# Patient Record
Sex: Female | Born: 1960 | Hispanic: Yes | Marital: Single | Smoking: Never smoker
Health system: Southern US, Community
[De-identification: ages and names within clinical notes are randomized; demographics above are authoritative.]

## PROBLEM LIST (undated history)

## (undated) DIAGNOSIS — I10 Essential (primary) hypertension: Secondary | ICD-10-CM

## (undated) DIAGNOSIS — R2231 Localized swelling, mass and lump, right upper limb: Secondary | ICD-10-CM

---

## 2005-02-22 ENCOUNTER — Ambulatory Visit (HOSPITAL_COMMUNITY): Admission: RE | Admit: 2005-02-22 | Discharge: 2005-02-22 | Payer: Self-pay | Admitting: Obstetrics

## 2010-10-17 ENCOUNTER — Encounter: Payer: Self-pay | Admitting: Obstetrics

## 2016-12-25 HISTORY — PX: FINGER SURGERY: SHX640

## 2017-01-15 ENCOUNTER — Encounter (HOSPITAL_BASED_OUTPATIENT_CLINIC_OR_DEPARTMENT_OTHER): Payer: Self-pay | Admitting: Emergency Medicine

## 2017-01-15 ENCOUNTER — Emergency Department (HOSPITAL_BASED_OUTPATIENT_CLINIC_OR_DEPARTMENT_OTHER): Payer: Worker's Compensation

## 2017-01-15 ENCOUNTER — Emergency Department (HOSPITAL_BASED_OUTPATIENT_CLINIC_OR_DEPARTMENT_OTHER)
Admission: EM | Admit: 2017-01-15 | Discharge: 2017-01-15 | Disposition: A | Discharge status: Home / Self Care | Payer: Worker's Compensation | Attending: Emergency Medicine | Admitting: Emergency Medicine

## 2017-01-15 DIAGNOSIS — Z23 Encounter for immunization: Secondary | ICD-10-CM | POA: Diagnosis not present

## 2017-01-15 DIAGNOSIS — Y929 Unspecified place or not applicable: Secondary | ICD-10-CM | POA: Insufficient documentation

## 2017-01-15 DIAGNOSIS — R42 Dizziness and giddiness: Secondary | ICD-10-CM | POA: Diagnosis not present

## 2017-01-15 DIAGNOSIS — W230XXA Caught, crushed, jammed, or pinched between moving objects, initial encounter: Secondary | ICD-10-CM | POA: Insufficient documentation

## 2017-01-15 DIAGNOSIS — Y990 Civilian activity done for income or pay: Secondary | ICD-10-CM | POA: Diagnosis not present

## 2017-01-15 DIAGNOSIS — S6991XA Unspecified injury of right wrist, hand and finger(s), initial encounter: Secondary | ICD-10-CM | POA: Diagnosis present

## 2017-01-15 DIAGNOSIS — Y9389 Activity, other specified: Secondary | ICD-10-CM | POA: Diagnosis not present

## 2017-01-15 DIAGNOSIS — S62632B Displaced fracture of distal phalanx of right middle finger, initial encounter for open fracture: Secondary | ICD-10-CM

## 2017-01-15 DIAGNOSIS — S61214A Laceration without foreign body of right ring finger without damage to nail, initial encounter: Secondary | ICD-10-CM | POA: Diagnosis not present

## 2017-01-15 DIAGNOSIS — S62634B Displaced fracture of distal phalanx of right ring finger, initial encounter for open fracture: Secondary | ICD-10-CM | POA: Insufficient documentation

## 2017-01-15 MED ORDER — LIDOCAINE HCL (PF) 1 % IJ SOLN
10.0000 mL | Freq: Once | INTRAMUSCULAR | Status: DC
  Filled 2017-01-15: qty 10

## 2017-01-15 MED ORDER — OXYCODONE-ACETAMINOPHEN 5-325 MG PO TABS
1.0000 | ORAL_TABLET | Freq: Once | ORAL | Status: AC
Start: 2017-01-15 — End: 2017-01-15
  Administered 2017-01-15: 1 via ORAL
  Filled 2017-01-15: qty 1

## 2017-01-15 MED ORDER — ONDANSETRON 4 MG PO TBDP
4.0000 mg | ORAL_TABLET | Freq: Once | ORAL | Status: AC
  Administered 2017-01-15: 4 mg via ORAL
  Filled 2017-01-15: qty 1

## 2017-01-15 MED ORDER — CEPHALEXIN 500 MG PO CAPS: ORAL_CAPSULE | ORAL | 0 refills | Status: DC

## 2017-01-15 MED ORDER — OXYCODONE-ACETAMINOPHEN 5-325 MG PO TABS: 1.0000 | ORAL_TABLET | Freq: Four times a day (QID) | ORAL | 0 refills | Status: DC | PRN

## 2017-01-15 MED ORDER — LIDOCAINE HCL (PF) 1 % IJ SOLN
INTRAMUSCULAR | Status: AC
  Filled 2017-01-15: qty 5

## 2017-01-15 MED ORDER — TETANUS-DIPHTH-ACELL PERTUSSIS 5-2.5-18.5 LF-MCG/0.5 IM SUSP
0.5000 mL | Freq: Once | INTRAMUSCULAR | Status: AC
  Administered 2017-01-15: 0.5 mL via INTRAMUSCULAR
  Filled 2017-01-15: qty 0.5

## 2017-01-15 NOTE — ED Provider Notes (Signed)
[facility] Provider Note   CSN: [id number] Arrival date & time: 01/15/17  2125  By signing my name below, I, Linna Darner, attest that this documentation has been prepared under the direction and in the presence of non-physician practitioner, Fayrene Helper, PA-C . Electronically Signed: Linna Darner, Scribe. 01/15/2017. 9:42 PM.  History   Chief Complaint Chief Complaint  Patient presents with  . Extremity Laceration    The history is provided by the patient. No language interpreter was used.     HPI Comments: Kari Webb is a right-hand dominant 56 y.o. female who presents to the Emergency Department complaining of a laceration to her right ring finger sustained approximately one hour ago. She states she lacerated her right ring finger while cleaning a machine that contained a large knife at work. She states she "jammed" her right middle finger during the incident as well and reports pain and swelling to this digit. Patient endorses sharp pain secondary to the wound and notes she has been nauseous and dizzy since the laceration was sustained. She soaked the wounded finger in water PTA and notes it initially bled significantly. Patient has controlled the bleeding by applying tissues to the wound. Her tetanus status is unknown. She denies vomiting, syncope, numbness/tingling, or any other associated symptoms.    History reviewed. No pertinent past medical history.  There are no active problems to display for this patient.   History reviewed. No pertinent surgical history.  OB History    No data available       Home Medications    Prior to Admission medications   Not on File    Family History History reviewed. No pertinent family history.  Social History Social History  Substance Use Topics  . Smoking status: Never Smoker  . Smokeless tobacco: Never Used  . Alcohol use No     Allergies   Asa [aspirin]   Review of Systems Review of Systems    Gastrointestinal: Positive for nausea. Negative for vomiting.  Musculoskeletal: Positive for arthralgias and joint swelling.  Skin: Positive for wound.  Neurological: Positive for dizziness. Negative for syncope and numbness.   Physical Exam Updated Vital Signs BP (!) 146/91 (BP Location: Right Arm)   Pulse 86   Temp 98.5 F (36.9 C) (Oral)   Resp 18   Ht  (1.575 m)   Wt 176 lb (79.8 kg)   SpO2 100%   BMI 32.19 kg/m   Physical Exam  Constitutional: She is oriented to person, place, and time. She appears well-developed and well-nourished. No distress.  HENT:  Head: Normocephalic and atraumatic.  Eyes: Conjunctivae and EOM are normal.  Neck: Neck supple. No tracheal deviation present.  Cardiovascular: Normal rate.   Pulmonary/Chest: Effort normal. No respiratory distress.  Musculoskeletal: Normal range of motion. She exhibits tenderness.  Tenderness and swelling noted to tip of the right third finger on palpation without nail involvement.   Neurological: She is alert and oriented to person, place, and time.  Skin: Skin is warm and dry. Laceration noted.  Right hand, ring finger: There is a 3 cm deep laceration over the pad of the finger without nail involvement. There is a subungual hematoma involving 90% of the nail. Sensation is intact distally. No joint involvement at the DIP.  Psychiatric: She has a normal mood and affect. Her behavior is normal.  Nursing note and vitals reviewed.  ED Treatments / Results  Labs (all labs ordered are listed, but only abnormal results are displayed)  Labs Reviewed - No data to display  EKG  EKG Interpretation None       Radiology Dg Hand Complete Right  Result Date: 01/15/2017 CLINICAL DATA:  56 year old female with cut in the distal aspect of the fourth digit EXAM: RIGHT HAND - COMPLETE 3+ VIEW COMPARISON:  None. FINDINGS: There is a multi fragmented and mildly displaced fracture of the mid to distal portion of the distal  phalanx of the fourth digit. No other acute fracture identified. 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 There is no dislocation. The bones are mildly osteopenic. There is laceration of the skin of the distal aspect of the fourth digit. No radiopaque foreign object identified. IMPRESSION: Laceration of the skin of the distal fourth digit with mildly displaced multi fragmented fracture of the distal phalanx of the fourth digit. No radiopaque foreign object. Electronically Signed   By: Elgie Collard M.D.   On: 01/15/2017 22:15    Procedures .Marland KitchenLaceration Repair Date/Time: 01/15/2017 11:20 PM Performed by: Fayrene Helper Authorized by: Fayrene Helper   Consent:    Consent obtained:  Verbal   Consent given by:  Patient   Risks discussed:  Infection, poor wound healing, nerve damage and poor cosmetic result   Alternatives discussed:  No treatment, referral and delayed treatment Anesthesia (see MAR for exact dosages):    Anesthesia method:  Nerve block   Block location:  Digital block   Block needle gauge:  25 G   Block anesthetic:  Lidocaine 1% w/o epi   Block injection procedure:  Anatomic landmarks identified   Block outcome:  Anesthesia achieved Laceration details:    Location:  Finger   Finger location:  R ring finger   Length (cm):  3   Depth (mm):  9 Repair type:    Repair type:  Simple Pre-procedure details:    Preparation:  Patient was prepped and draped in usual sterile fashion Exploration:    Hemostasis achieved with:  Direct pressure   Wound exploration: wound explored through full range of motion     Wound extent: muscle damage, underlying fracture and vascular damage     Contaminated: no   Treatment:    Area cleansed with:  Betadine   Amount of cleaning:  Extensive   Irrigation solution:  Sterile saline   Irrigation method:  Pressure wash   Visualized foreign bodies/material removed: no   Skin repair:    Repair method:  Sutures Approximation:     Approximation:  Loose   Vermilion border: well-aligned   Post-procedure details:    Dressing:  Sterile dressing and splint for protection   Patient tolerance of procedure:  Tolerated well, no immediate complications   (including critical care time)    DIAGNOSTIC STUDIES: Oxygen Saturation is 100% on RA, normal by my interpretation.    COORDINATION OF CARE: 9:47 PM Discussed treatment plan with pt at bedside and pt agreed to plan.  Medications Ordered in ED Medications  ondansetron (ZOFRAN-ODT) disintegrating tablet 4 mg (not administered)  oxyCODONE-acetaminophen (PERCOCET/ROXICET) 5-325 MG per tablet 1 tablet (not administered)  Tdap (BOOSTRIX) injection 0.5 mL (not administered)     Initial Impression / Assessment and Plan / ED Course  I have reviewed the triage vital signs and the nursing notes.  Pertinent labs & imaging results that were available during my care of the patient were reviewed by me and considered in my medical decision making (see chart for details).     BP (!) 146/91 (BP Location:  Right Arm)   Pulse 86   Temp 98.5 F (36.9 C) (Oral)   Resp 18   Ht  (1.575 m)   Wt 79.8 kg   SpO2 100%   BMI 32.19 kg/m    Final Clinical Impressions(s) / ED Diagnoses  Tetanus update in the ED. Laceration occurred < 12 hours prior to repair. Discussed laceration care with pt and answered questions. Pt to f-u for suture removal in 1 days and wound check sooner should there be signs of dehiscence or infection. Pt is hemodynamically stable with no complaints prior to dc.  I have consulted oncall hand specialist Dr. Merlyn Lot who recommend keflex and call office tomorrow for f/u.  Final diagnoses:  Open displaced fracture of distal phalanx of right middle finger, initial encounter   New Prescriptions New Prescriptions   CEPHALEXIN (KEFLEX) 500 MG CAPSULE    2 caps po bid x 7 days   OXYCODONE-ACETAMINOPHEN (PERCOCET) 5-325 MG TABLET    Take 1 tablet by mouth every 6 (six)  hours as needed.   I personally performed the services described in this documentation, which was scribed in my presence. The recorded information has been reviewed and is accurate.      Fayrene Helper, PA-C 01/15/17 [id number]    Maia Plan, MD 01/16/17 [phone]

## 2017-01-15 NOTE — Discharge Instructions (Signed)
You have a broken finger with a laceration.  Please call Dr. Merlyn Lot office tomorrow for close follow up. Take percocet as needed for pain.  Take Keflex to prevent infection.  Return if you have any concerns.

## 2017-01-15 NOTE — ED Triage Notes (Signed)
Patient states that she was cleaning a machine at work and one of the blades cut her on the right ring finger

## 2017-01-15 NOTE — ED Notes (Signed)
PT discharged to home NAD.  

## 2017-05-01 ENCOUNTER — Ambulatory Visit (HOSPITAL_BASED_OUTPATIENT_CLINIC_OR_DEPARTMENT_OTHER): Admit: 2017-05-01 | Payer: Self-pay | Admitting: Orthopedic Surgery

## 2017-05-01 ENCOUNTER — Encounter (HOSPITAL_BASED_OUTPATIENT_CLINIC_OR_DEPARTMENT_OTHER): Payer: Self-pay

## 2017-05-01 SURGERY — EXCISION MASS
Anesthesia: Choice | Laterality: Right

## 2017-07-31 ENCOUNTER — Other Ambulatory Visit: Payer: Self-pay | Admitting: Orthopedic Surgery

## 2017-07-31 DIAGNOSIS — R2231 Localized swelling, mass and lump, right upper limb: Secondary | ICD-10-CM

## 2017-08-10 ENCOUNTER — Ambulatory Visit
Admission: RE | Admit: 2017-08-10 | Discharge: 2017-08-10 | Disposition: A | Discharge status: Home / Self Care | Payer: Worker's Compensation | Source: Ambulatory Visit | Attending: Orthopedic Surgery | Admitting: Orthopedic Surgery

## 2017-08-10 DIAGNOSIS — R2231 Localized swelling, mass and lump, right upper limb: Secondary | ICD-10-CM

## 2017-12-07 ENCOUNTER — Other Ambulatory Visit: Payer: Self-pay | Admitting: Orthopedic Surgery

## 2017-12-07 NOTE — Addendum Note (Signed)
Addended by: Betha LoaKUZMA, Sanjuanita Condrey on: 12/07/2017 10:16 AM   Modules accepted: Orders

## 2017-12-20 ENCOUNTER — Encounter (HOSPITAL_BASED_OUTPATIENT_CLINIC_OR_DEPARTMENT_OTHER): Payer: Self-pay | Admitting: *Deleted

## 2017-12-20 ENCOUNTER — Other Ambulatory Visit: Payer: Self-pay

## 2017-12-21 ENCOUNTER — Ambulatory Visit (HOSPITAL_BASED_OUTPATIENT_CLINIC_OR_DEPARTMENT_OTHER)
Admission: RE | Admit: 2017-12-21 | Discharge: 2017-12-21 | Disposition: A | Discharge status: Home / Self Care | Source: Ambulatory Visit | Attending: Orthopedic Surgery | Admitting: Orthopedic Surgery

## 2017-12-21 ENCOUNTER — Ambulatory Visit (HOSPITAL_BASED_OUTPATIENT_CLINIC_OR_DEPARTMENT_OTHER): Admitting: Anesthesiology

## 2017-12-21 ENCOUNTER — Encounter (HOSPITAL_BASED_OUTPATIENT_CLINIC_OR_DEPARTMENT_OTHER)
Admission: RE | Disposition: A | Discharge status: Home / Self Care | Payer: Self-pay | Source: Ambulatory Visit | Attending: Orthopedic Surgery

## 2017-12-21 ENCOUNTER — Encounter (HOSPITAL_BASED_OUTPATIENT_CLINIC_OR_DEPARTMENT_OTHER): Payer: Self-pay | Admitting: *Deleted

## 2017-12-21 ENCOUNTER — Other Ambulatory Visit: Payer: Self-pay

## 2017-12-21 DIAGNOSIS — Z886 Allergy status to analgesic agent status: Secondary | ICD-10-CM | POA: Insufficient documentation

## 2017-12-21 DIAGNOSIS — I10 Essential (primary) hypertension: Secondary | ICD-10-CM | POA: Diagnosis not present

## 2017-12-21 DIAGNOSIS — R2231 Localized swelling, mass and lump, right upper limb: Secondary | ICD-10-CM | POA: Diagnosis present

## 2017-12-21 DIAGNOSIS — Z6831 Body mass index (BMI) 31.0-31.9, adult: Secondary | ICD-10-CM | POA: Insufficient documentation

## 2017-12-21 DIAGNOSIS — M67441 Ganglion, right hand: Secondary | ICD-10-CM | POA: Insufficient documentation

## 2017-12-21 HISTORY — DX: Essential (primary) hypertension: I10

## 2017-12-21 HISTORY — DX: Localized swelling, mass and lump, right upper limb: R22.31

## 2017-12-21 HISTORY — PX: MASS EXCISION: SHX2000

## 2017-12-21 SURGERY — EXCISION MASS
Anesthesia: Monitor Anesthesia Care | Site: Hand | Laterality: Right

## 2017-12-21 MED ORDER — HYDROCODONE-ACETAMINOPHEN 5-325 MG PO TABS: ORAL_TABLET | ORAL | 0 refills | Status: DC

## 2017-12-21 MED ORDER — CHLORHEXIDINE GLUCONATE 4 % EX LIQD: 60.0000 mL | Freq: Once | CUTANEOUS | Status: DC

## 2017-12-21 MED ORDER — PROPOFOL 500 MG/50ML IV EMUL
INTRAVENOUS | Status: DC | PRN
  Administered 2017-12-21: 75 ug/kg/min via INTRAVENOUS

## 2017-12-21 MED ORDER — 0.9 % SODIUM CHLORIDE (POUR BTL) OPTIME
TOPICAL | Status: DC | PRN
  Administered 2017-12-21: 100 mL

## 2017-12-21 MED ORDER — MEPERIDINE HCL 25 MG/ML IJ SOLN: 6.2500 mg | INTRAMUSCULAR | Status: DC | PRN

## 2017-12-21 MED ORDER — FENTANYL CITRATE (PF) 100 MCG/2ML IJ SOLN
INTRAMUSCULAR | Status: AC
  Filled 2017-12-21: qty 2

## 2017-12-21 MED ORDER — SCOPOLAMINE 1 MG/3DAYS TD PT72
1.0000 | MEDICATED_PATCH | Freq: Once | TRANSDERMAL | Status: DC | PRN
Start: 2017-12-21 — End: 2017-12-21

## 2017-12-21 MED ORDER — ONDANSETRON HCL 4 MG/2ML IJ SOLN
INTRAMUSCULAR | Status: AC
  Filled 2017-12-21: qty 2

## 2017-12-21 MED ORDER — FENTANYL CITRATE (PF) 100 MCG/2ML IJ SOLN
50.0000 ug | INTRAMUSCULAR | Status: DC | PRN
  Administered 2017-12-21: 100 ug via INTRAVENOUS

## 2017-12-21 MED ORDER — HYDROCODONE-ACETAMINOPHEN 5-325 MG PO TABS
ORAL_TABLET | ORAL | Status: AC
  Filled 2017-12-21: qty 1

## 2017-12-21 MED ORDER — CEFAZOLIN SODIUM-DEXTROSE 2-4 GM/100ML-% IV SOLN
2.0000 g | INTRAVENOUS | Status: AC
  Administered 2017-12-21: 2 g via INTRAVENOUS

## 2017-12-21 MED ORDER — PROPOFOL 10 MG/ML IV BOLUS
INTRAVENOUS | Status: DC | PRN
  Administered 2017-12-21: 20 mg via INTRAVENOUS

## 2017-12-21 MED ORDER — MIDAZOLAM HCL 2 MG/2ML IJ SOLN
INTRAMUSCULAR | Status: AC
  Filled 2017-12-21: qty 2

## 2017-12-21 MED ORDER — MIDAZOLAM HCL 2 MG/2ML IJ SOLN: 0.5000 mg | Freq: Once | INTRAMUSCULAR | Status: DC | PRN

## 2017-12-21 MED ORDER — LIDOCAINE HCL (PF) 1 % IJ SOLN
INTRAMUSCULAR | Status: DC | PRN
  Administered 2017-12-21: 5 mL

## 2017-12-21 MED ORDER — LACTATED RINGERS IV SOLN
INTRAVENOUS | Status: DC
  Administered 2017-12-21: 10:00:00 via INTRAVENOUS

## 2017-12-21 MED ORDER — PROMETHAZINE HCL 25 MG/ML IJ SOLN: 6.2500 mg | INTRAMUSCULAR | Status: DC | PRN

## 2017-12-21 MED ORDER — FENTANYL CITRATE (PF) 100 MCG/2ML IJ SOLN
25.0000 ug | INTRAMUSCULAR | Status: DC | PRN
  Administered 2017-12-21: 50 ug via INTRAVENOUS
  Administered 2017-12-21 (×2): 25 ug via INTRAVENOUS

## 2017-12-21 MED ORDER — LIDOCAINE HCL (PF) 1 % IJ SOLN
INTRAMUSCULAR | Status: AC
  Filled 2017-12-21: qty 5

## 2017-12-21 MED ORDER — BUPIVACAINE HCL (PF) 0.25 % IJ SOLN
INTRAMUSCULAR | Status: DC | PRN
  Administered 2017-12-21: 5 mL

## 2017-12-21 MED ORDER — MIDAZOLAM HCL 2 MG/2ML IJ SOLN
1.0000 mg | INTRAMUSCULAR | Status: DC | PRN
  Administered 2017-12-21: 2 mg via INTRAVENOUS

## 2017-12-21 MED ORDER — HYDROCODONE-ACETAMINOPHEN 5-325 MG PO TABS
1.0000 | ORAL_TABLET | Freq: Once | ORAL | Status: AC
  Administered 2017-12-21: 1 via ORAL

## 2017-12-21 MED ORDER — CEFAZOLIN SODIUM-DEXTROSE 2-4 GM/100ML-% IV SOLN
INTRAVENOUS | Status: AC
  Filled 2017-12-21: qty 100

## 2017-12-21 SURGICAL SUPPLY — 59 items
BANDAGE ACE 3X5.8 VEL STRL LF (GAUZE/BANDAGES/DRESSINGS) IMPLANT
BANDAGE COBAN STERILE 2 (GAUZE/BANDAGES/DRESSINGS) IMPLANT
BENZOIN TINCTURE PRP APPL 2/3 (GAUZE/BANDAGES/DRESSINGS) IMPLANT
BLADE MINI RND TIP GREEN BEAV (BLADE) IMPLANT
BLADE SURG 15 STRL LF DISP TIS (BLADE) ×2 IMPLANT
BLADE SURG 15 STRL SS (BLADE) ×2
BNDG COHESIVE 1X5 TAN STRL LF (GAUZE/BANDAGES/DRESSINGS) IMPLANT
BNDG CONFORM 2 STRL LF (GAUZE/BANDAGES/DRESSINGS) IMPLANT
BNDG ELASTIC 2X5.8 VLCR STR LF (GAUZE/BANDAGES/DRESSINGS) IMPLANT
BNDG ESMARK 4X9 LF (GAUZE/BANDAGES/DRESSINGS) IMPLANT
BNDG GAUZE 1X2.1 STRL (MISCELLANEOUS) IMPLANT
BNDG GAUZE ELAST 4 BULKY (GAUZE/BANDAGES/DRESSINGS) IMPLANT
BNDG PLASTER X FAST 3X3 WHT LF (CAST SUPPLIES) IMPLANT
CHLORAPREP W/TINT 26ML (MISCELLANEOUS) ×2 IMPLANT
CORD BIPOLAR FORCEPS 12FT (ELECTRODE) ×2 IMPLANT
COVER BACK TABLE 60X90IN (DRAPES) ×2 IMPLANT
COVER MAYO STAND STRL (DRAPES) ×2 IMPLANT
CUFF TOURNIQUET SINGLE 18IN (TOURNIQUET CUFF) ×2 IMPLANT
DRAPE EXTREMITY T 121X128X90 (DRAPE) ×2 IMPLANT
DRAPE SURG 17X23 STRL (DRAPES) ×2 IMPLANT
GAUZE SPONGE 4X4 12PLY STRL (GAUZE/BANDAGES/DRESSINGS) ×2 IMPLANT
GAUZE XEROFORM 1X8 LF (GAUZE/BANDAGES/DRESSINGS) ×2 IMPLANT
GLOVE BIO SURGEON STRL SZ7.5 (GLOVE) ×2 IMPLANT
GLOVE BIOGEL PI IND STRL 7.0 (GLOVE) ×1 IMPLANT
GLOVE BIOGEL PI IND STRL 7.5 (GLOVE) ×2 IMPLANT
GLOVE BIOGEL PI IND STRL 8 (GLOVE) ×1 IMPLANT
GLOVE BIOGEL PI INDICATOR 7.0 (GLOVE) ×1
GLOVE BIOGEL PI INDICATOR 7.5 (GLOVE) ×2
GLOVE BIOGEL PI INDICATOR 8 (GLOVE) ×1
GLOVE SURG SS PI 8.5 STRL IVOR (GLOVE) ×1
GLOVE SURG SS PI 8.5 STRL STRW (GLOVE) ×1 IMPLANT
GLOVE SURG SYN 7.5  E (GLOVE) ×1
GLOVE SURG SYN 7.5 E (GLOVE) ×1 IMPLANT
GOWN STRL REUS W/ TWL LRG LVL3 (GOWN DISPOSABLE) IMPLANT
GOWN STRL REUS W/ TWL XL LVL3 (GOWN DISPOSABLE) ×2 IMPLANT
GOWN STRL REUS W/TWL LRG LVL3 (GOWN DISPOSABLE)
GOWN STRL REUS W/TWL XL LVL3 (GOWN DISPOSABLE) ×4 IMPLANT
NDL SAFETY ECLIPSE 18X1.5 (NEEDLE) ×1 IMPLANT
NEEDLE HYPO 18GX1.5 SHARP (NEEDLE) ×1
NEEDLE HYPO 25X1 1.5 SAFETY (NEEDLE) ×2 IMPLANT
NS IRRIG 1000ML POUR BTL (IV SOLUTION) ×2 IMPLANT
PACK BASIN DAY SURGERY FS (CUSTOM PROCEDURE TRAY) ×2 IMPLANT
PAD CAST 3X4 CTTN HI CHSV (CAST SUPPLIES) IMPLANT
PAD CAST 4YDX4 CTTN HI CHSV (CAST SUPPLIES) IMPLANT
PADDING CAST ABS 4INX4YD NS (CAST SUPPLIES)
PADDING CAST ABS COTTON 4X4 ST (CAST SUPPLIES) IMPLANT
PADDING CAST COTTON 3X4 STRL (CAST SUPPLIES)
PADDING CAST COTTON 4X4 STRL (CAST SUPPLIES)
STOCKINETTE 4X48 STRL (DRAPES) ×2 IMPLANT
STRIP CLOSURE SKIN 1/2X4 (GAUZE/BANDAGES/DRESSINGS) IMPLANT
SUT ETHILON 3 0 PS 1 (SUTURE) IMPLANT
SUT ETHILON 4 0 PS 2 18 (SUTURE) ×2 IMPLANT
SUT ETHILON 5 0 P 3 18 (SUTURE)
SUT NYLON ETHILON 5-0 P-3 1X18 (SUTURE) IMPLANT
SUT VIC AB 4-0 P2 18 (SUTURE) IMPLANT
SYR BULB 3OZ (MISCELLANEOUS) ×2 IMPLANT
SYR CONTROL 10ML LL (SYRINGE) ×2 IMPLANT
TOWEL OR 17X24 6PK STRL BLUE (TOWEL DISPOSABLE) ×4 IMPLANT
UNDERPAD 30X30 (UNDERPADS AND DIAPERS) ×2 IMPLANT

## 2017-12-21 NOTE — Brief Op Note (Signed)
12/21/2017  11:20 AM  PATIENT:  Minette Headland  57 y.o. female  PRE-OPERATIVE DIAGNOSIS:  RIGHT LONG FINGER MASS  POST-OPERATIVE DIAGNOSIS:  RIGHT LONG FINGER MASS  PROCEDURE:  Procedure(s): RIGHT LONG FINGER EXPLORATION AND EXCISION MASS (Right)  SURGEON:  Surgeon(s) and Role:    Leanora Cover, MD - Primary  PHYSICIAN ASSISTANT:   ASSISTANTS: none   ANESTHESIA:   local and MAC  EBL:  Minimal   BLOOD ADMINISTERED:none  DRAINS: none   LOCAL MEDICATIONS USED:  MARCAINE    and LIDOCAINE   SPECIMEN:  Source of Specimen:  right long finger  DISPOSITION OF SPECIMEN:  PATHOLOGY  COUNTS:  YES  TOURNIQUET:   Total Tourniquet Time Documented: Upper Arm (Right) - 21 minutes Total: Upper Arm (Right) - 21 minutes   DICTATION: .Other Dictation: Dictation Number [id number]  PLAN OF CARE: Discharge to home after PACU  PATIENT DISPOSITION:  PACU - hemodynamically stable.

## 2017-12-21 NOTE — Anesthesia Preprocedure Evaluation (Signed)
Anesthesia Evaluation  Patient identified by MRN, date of birth, ID band Patient awake    Reviewed: Allergy & Precautions, NPO status , Patient's Chart, lab work & pertinent test results  History of Anesthesia Complications Negative for: history of anesthetic complications  Airway Mallampati: II  TM Distance: >3 FB Neck ROM: Full    Dental  (+) Dental Advisory Given   Pulmonary neg pulmonary ROS,    breath sounds clear to auscultation       Cardiovascular hypertension, Pt. on medications  Rhythm:Regular Rate:Normal     Neuro/Psych negative neurological ROS     GI/Hepatic negative GI ROS, Neg liver ROS,   Endo/Other  negative endocrine ROSMorbid obesity  Renal/GU negative Renal ROS     Musculoskeletal   Abdominal (+) + obese,   Peds  Hematology negative hematology ROS (+)   Anesthesia Other Findings   Reproductive/Obstetrics                             Anesthesia Physical Anesthesia Plan  ASA: II  Anesthesia Plan: MAC   Post-op Pain Management:    Induction:   PONV Risk Score and Plan: 2 and Ondansetron and Dexamethasone  Airway Management Planned: Natural Airway and Nasal Cannula  Additional Equipment:   Intra-op Plan:   Post-operative Plan:   Informed Consent: I have reviewed the patients History and Physical, chart, labs and discussed the procedure including the risks, benefits and alternatives for the proposed anesthesia with the patient or authorized representative who has indicated his/her understanding and acceptance.   Dental advisory given  Plan Discussed with: CRNA and Surgeon  Anesthesia Plan Comments: (Plan routine monitors, MAC)        Anesthesia Quick Evaluation

## 2017-12-21 NOTE — Op Note (Signed)
NAMECARRINA, SCHOENBERGER                 ACCOUNT NO.:  [id number]  MEDICAL RECORD NO.:  [id number]  LOCATION:                                 FACILITY:  PHYSICIAN:  Leanora Cover, MD             DATE OF BIRTH:  DATE OF PROCEDURE:  12/21/2017 DATE OF DISCHARGE:                              OPERATIVE REPORT   PREOPERATIVE DIAGNOSIS:  Right long finger mass.  POSTOPERATIVE DIAGNOSIS:  Right long finger mass.  PROCEDURE:  Excision of mass of right long finger from tissues deep to the fascia measuring less than 0.5 cm in diameter.  SURGEON:  Leanora Cover, MD.  ASSISTANT:  None.  ANESTHESIA:  Local with MAC.  IV FLUIDS:  Per anesthesia flow sheet.  ESTIMATED BLOOD LOSS:  Minimal.  COMPLICATIONS:  None.  SPECIMENS:  Right long finger mass to Pathology.  TOURNIQUET TIME:  20 minutes.  DISPOSITION:  Stable to PACU.  INDICATIONS:  Ms. Lepp is a 57 year old female, who sustained an injury to the right long finger at her work.  She has noted a mass in the finger.  This is bothersome to her.  She wishes to have it removed. Risks, benefits, and alternative of surgery were discussed including the risks of blood loss, infection, damage to nerves, vessels, tendons, ligaments, and bone, failure of surgery, need for additional surgery, complications with wound healing, continued pain, recurrence of mass. She voiced understanding of these risks and elected to proceed.  OPERATIVE COURSE:  After being identified preoperatively by myself, the patient and I agreed upon the procedure and site of procedure.  Surgical site was marked.  The risks, benefits, and alternatives of surgery were reviewed and she wished to proceed.  Surgical consent had been signed. She was given IV Ancef as preoperative antibiotic prophylaxis.  She was transferred to the operating room and placed on the operating room table in supine position with the right upper extremity on an arm board.  MAC anesthesia was induced.  A  surgical pause was performed between surgeons, anesthesia, and operating room staff; and all were in agreement as to the patient, procedure, and site of procedure.  A digital block was performed with 10 mL of half and half solution of 1% plain lidocaine and 0.25% plain Marcaine.  The right upper extremity was prepped and draped in a normal sterile orthopedic fashion.  A surgical pause was again performed between surgeon, anesthesia, and operating room staff; and all were in agreement as to the procedure and site of procedure.  A Brunner-type incision was made over the PIP joint of the long finger.  This was carried down the subcutaneous tissues by spreading technique.  Bipolar electrocautery was used to obtain hemostasis.  There was scar tissue under the skin adherent to the skin and sheath underneath.  This was carefully freed up.  Some was removed and sent to Pathology for examination.  There was no mass palpable within the skin flap.  The A3 pulley was visualized.  The scar tissue was removed from top of it.  The tendons were visualized both proximal and distal to this.  The FDP and FDS  tendons were intact.  Traction on the FDP tendon would cause flexion at the DIP joint.  There was some tenosynovitis in a glistening whitish tissue running along with the FDP tendon.  This was removed and sent to Pathology as well for examination. The skin was placed back over top of the finger.  The area was palpated. No mass was palpable.  There was some prominence to the A3 pulley, which appeared thickened and irritated.  The wound was copiously irrigated with sterile saline and closed with 4-0 nylon in a horizontal mattress fashion.  It was dressed with sterile Xeroform, 4 x 4 and wrapped with a Coban dressing lightly.  Tourniquet was deflated at 20 minutes. Fingertips were pink with brisk capillary refill after deflation of tourniquet.  The operative drapes were broken down, and the patient  was awoken from anesthesia safely.  She was transferred back to stretcher and taken to PACU in stable condition.  I will see her in the office in 1 week for postoperative followup.  I will give her Norco 5/325 one to two p.o. q.6 hours p.r.n. pain, dispensed #20.     Leanora Cover, MD   ______________________________ Leanora Cover, MD    KK/MEDQ  D:  12/21/2017  T:  12/21/2017  Job:  [id number]

## 2017-12-21 NOTE — Op Note (Signed)
[id number] 

## 2017-12-21 NOTE — Transfer of Care (Signed)
Immediate Anesthesia Transfer of Care Note  Patient: Kari Webb  Procedure(s) Performed: RIGHT LONG FINGER EXPLORATION AND EXCISION MASS (Right Hand)  Patient Location: PACU  Anesthesia Type:MAC  Level of Consciousness: awake, alert  and oriented  Airway & Oxygen Therapy: Patient Spontanous Breathing and Patient connected to face mask oxygen  Post-op Assessment: Report given to RN and Post -op Vital signs reviewed and stable  Post vital signs: Reviewed and stable  Last Vitals:  Vitals Value Taken Time  BP 112/63 12/21/2017 11:24 AM  Temp    Pulse 58 12/21/2017 11:27 AM  Resp 14 12/21/2017 11:27 AM  SpO2 98 % 12/21/2017 11:27 AM  Vitals shown include unvalidated device data.  Last Pain:  Vitals:   12/21/17 0923  TempSrc: Oral  PainSc: 0-No pain      Patients Stated Pain Goal: 0 (19/37/90 2409)  Complications: No apparent anesthesia complications

## 2017-12-21 NOTE — H&P (Signed)
  Kari Webb is an 57 y.o. female.   Chief Complaint: right long finger mass HPI: 57 yo female with mass right long finger.  This is bothersome to her and she wishes to have it removed.  Allergies:  Allergies  Allergen Reactions  . Asa [Aspirin] Rash    Past Medical History:  Diagnosis Date  . Finger mass, right    long finger  . Hypertension     Past Surgical History:  Procedure Laterality Date  . FINGER SURGERY Left 12/2016    Family History: History reviewed. No pertinent family history.  Social History:   reports that she has never smoked. She has never used smokeless tobacco. She reports that she does not drink alcohol or use drugs.  Medications: Medications Prior to Admission  Medication Sig Dispense Refill  . olmesartan (BENICAR) 20 MG tablet Take 20 mg by mouth daily.      No results found for this or any previous visit (from the past 48 hour(s)).  No results found.   A comprehensive review of systems was negative.  Blood pressure 136/74, pulse (!) 55, temperature 98 F (36.7 C), temperature source Oral, resp. rate 16, height 5\' 2"  (1.575 m), weight 77.5 kg (170 lb 12.8 oz), SpO2 98 %.  General appearance: alert, cooperative and appears stated age Head: Normocephalic, without obvious abnormality, atraumatic Neck: supple, symmetrical, trachea midline Cardio: regular rate and rhythm Resp: clear to auscultation bilaterally Extremities: Intact sensation and capillary refill all digits.  +epl/fpl/io.  No wounds.  Pulses: 2+ and symmetric Skin: Skin color, texture, turgor normal. No rashes or lesions Neurologic: Grossly normal Incision/Wound: none  Assessment/Plan Right long finger mass.  Non operative and operative treatment options were discussed with the patient and patient wishes to proceed with operative treatment. Risks, benefits, and alternatives of surgery were discussed and the patient agrees with the plan of care.   Kari Webb R 12/21/2017,  9:32 AM

## 2017-12-21 NOTE — Anesthesia Postprocedure Evaluation (Signed)
Anesthesia Post Note  Patient: Kari Webb  Procedure(s) Performed: RIGHT LONG FINGER EXPLORATION AND EXCISION MASS (Right Hand)     Patient location during evaluation: PACU Anesthesia Type: MAC Level of consciousness: awake and alert, oriented and patient cooperative Pain management: pain level controlled Vital Signs Assessment: post-procedure vital signs reviewed and stable Respiratory status: spontaneous breathing, nonlabored ventilation and respiratory function stable Cardiovascular status: blood pressure returned to baseline and stable Postop Assessment: no apparent nausea or vomiting Anesthetic complications: no    Last Vitals:  Vitals:   12/21/17 1130 12/21/17 1145  BP: 126/70 138/76  Pulse: 65 (!) 51  Resp: 16 14  Temp:    SpO2: 93% 97%    Last Pain:  Vitals:   12/21/17 1145  TempSrc:   PainSc: 3                  Zailey Audia,E. Anjeanette Petzold

## 2017-12-21 NOTE — Discharge Instructions (Addendum)

## 2017-12-22 ENCOUNTER — Encounter (HOSPITAL_BASED_OUTPATIENT_CLINIC_OR_DEPARTMENT_OTHER): Payer: Self-pay | Admitting: Orthopedic Surgery

## 2018-12-04 IMAGING — US US EXTREM UP *R* LTD
1 series · 8 of 8 positions shown · non-contrast
Comparison: None

CLINICAL DATA: Nodule along the right index finger.

EXAM:
ULTRASOUND RIGHT UPPER EXTREMITY LIMITED
TECHNIQUE: Ultrasound examination of the upper extremity soft tissues was
performed in the area of clinical concern.

[Series 1: us extrem up *right* ltd · 0.02mm/px · 8 of 8 slices shown]
[im 1/8]
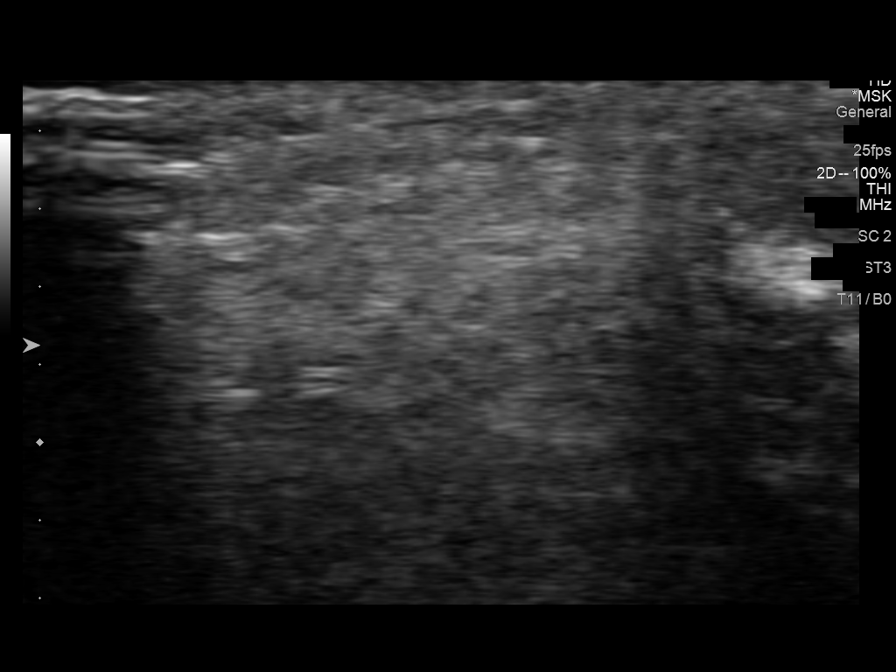
[im 2/8]
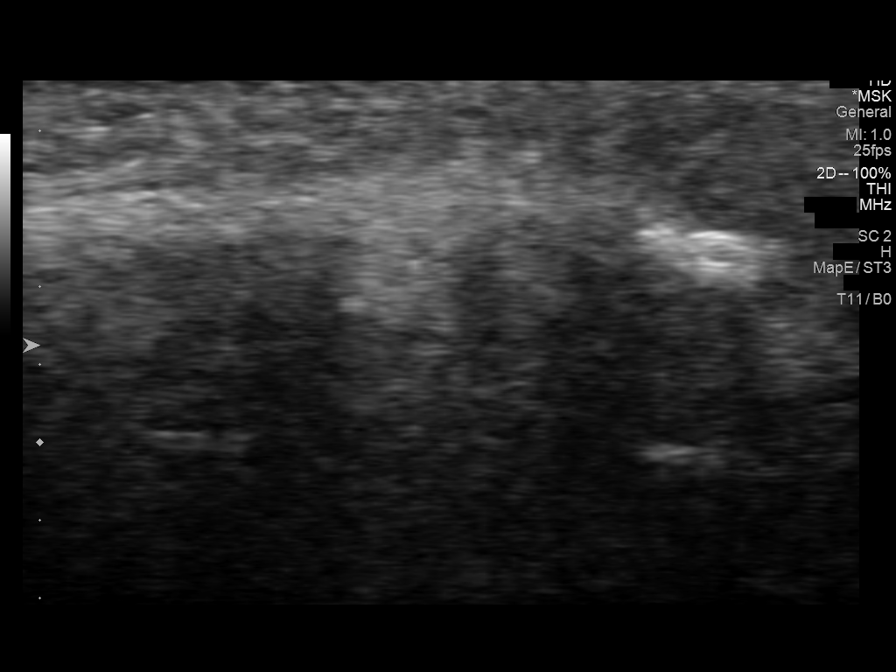
[im 3/8]
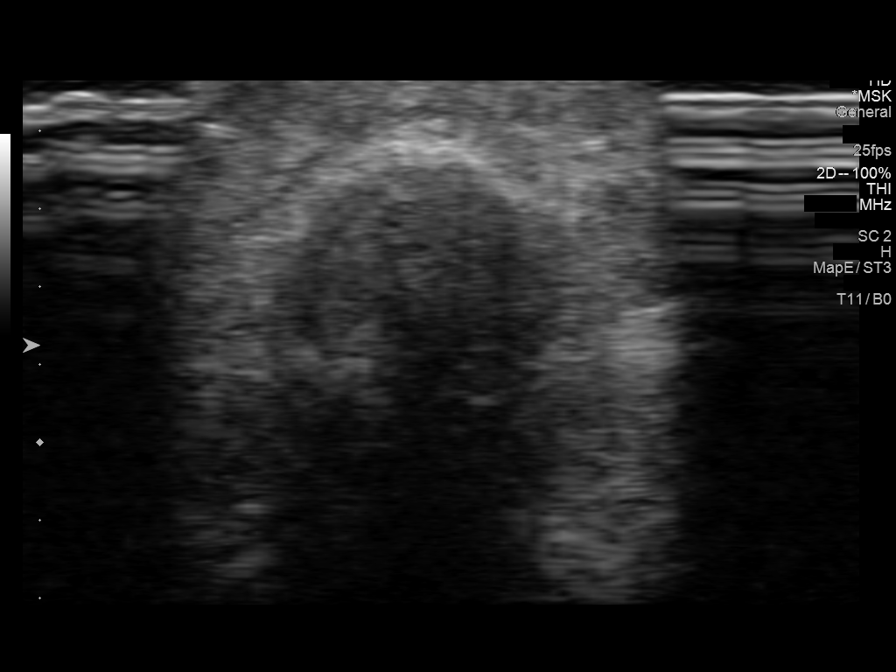
[im 4/8]
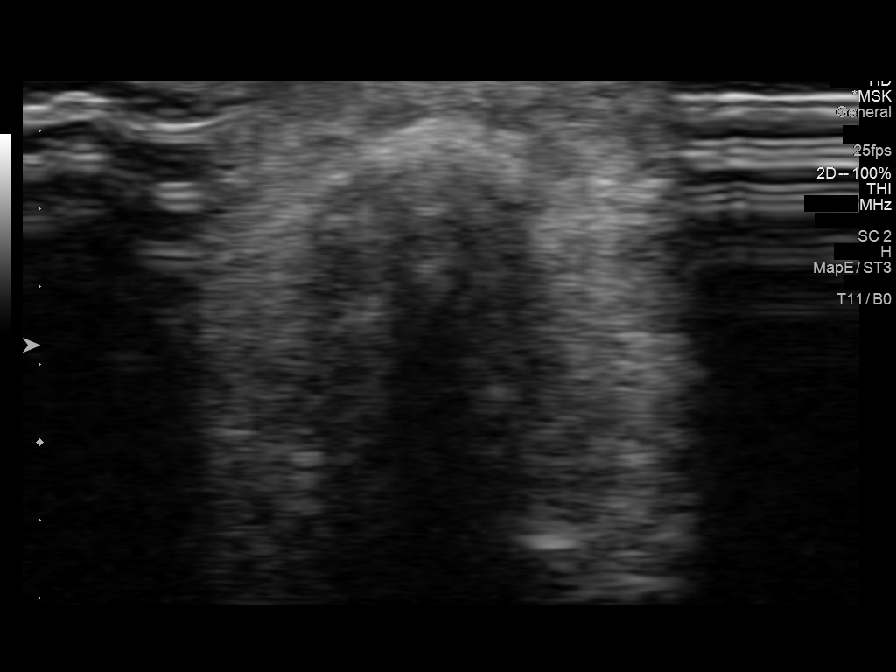
[im 5/8]
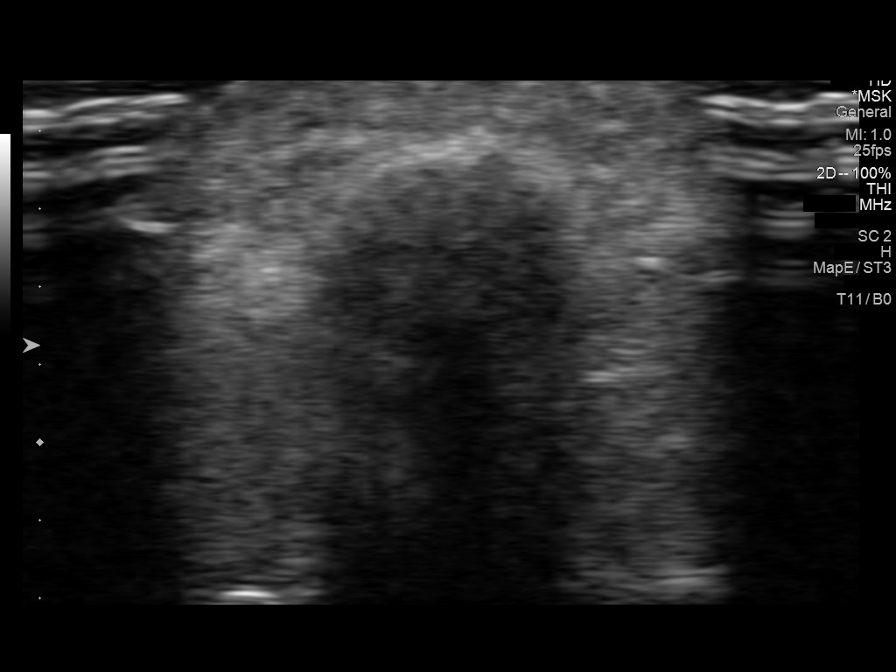
[im 6/8]
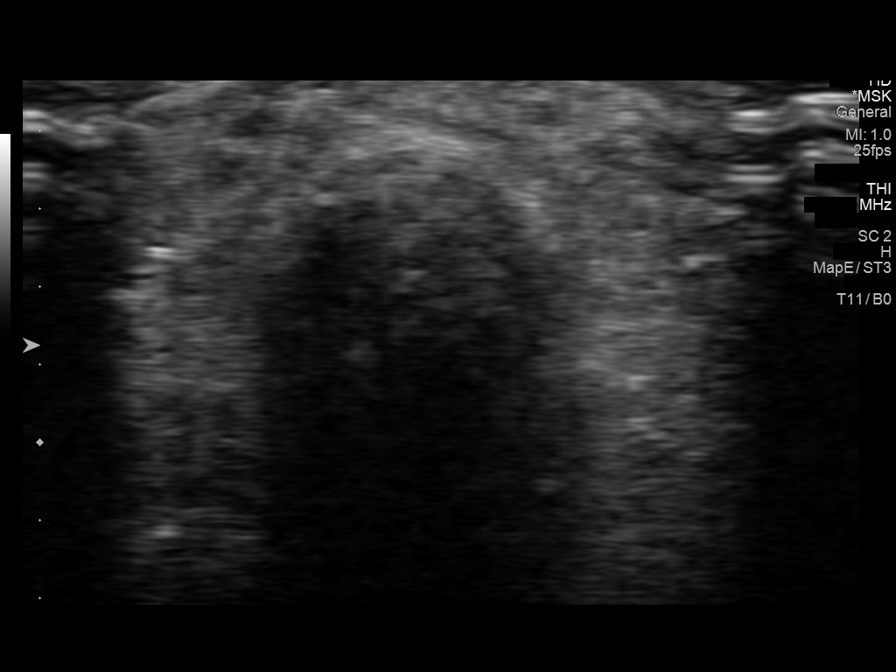
[im 7/8]
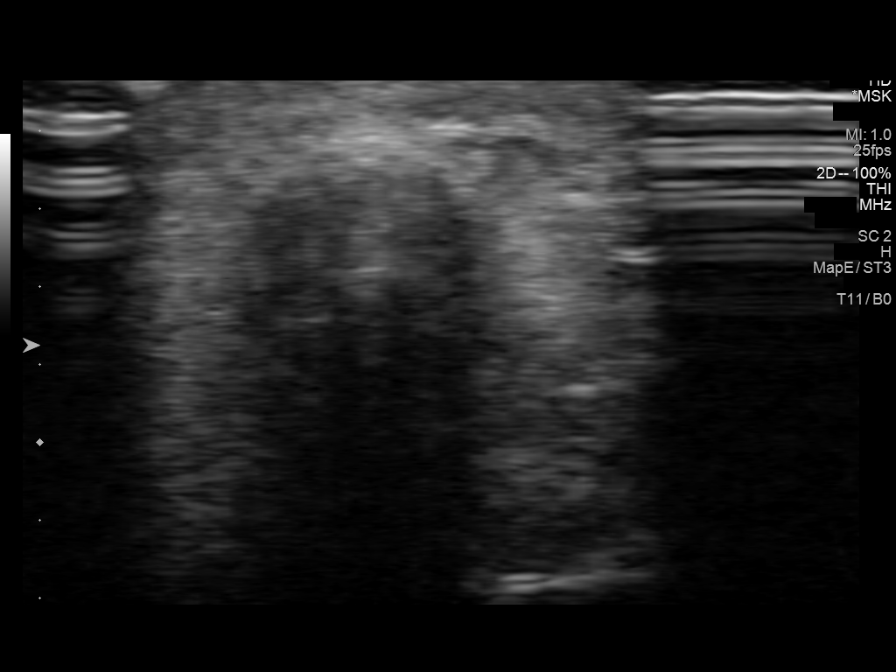
[im 8/8]
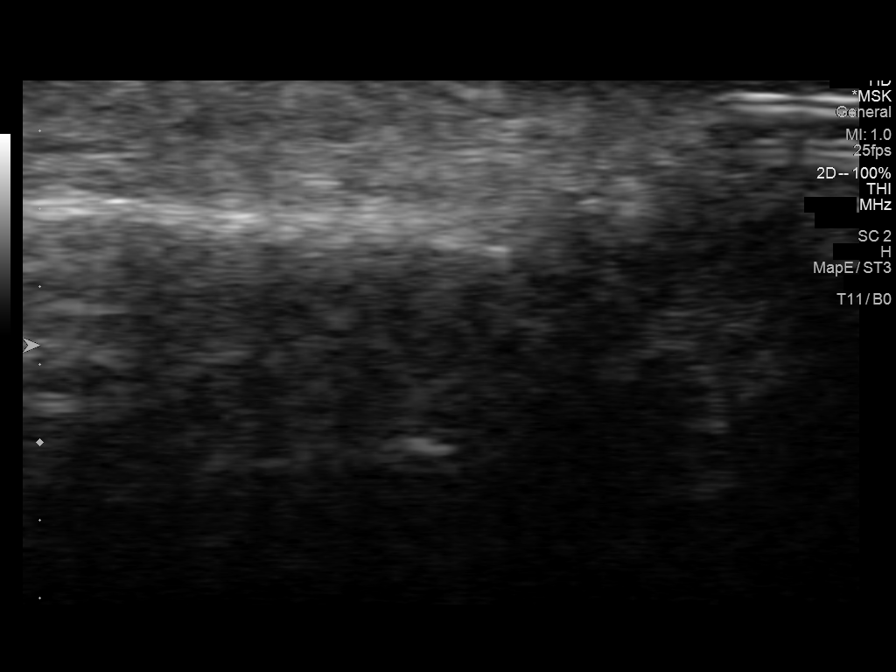

[8 of 8 positions shown; findings below may reference images not displayed]

FINDINGS: Real-time sonography of the dorsal aspect of the right middle finger
and ring finger was performed at the site of clinical concern. No
solid or cystic mass. Normal infant distortion.
IMPRESSION: No focal sonographic abnormality at the site of clinical concern
involving the right middle finger and right ring finger.

## 2019-09-25 ENCOUNTER — Other Ambulatory Visit: Payer: Self-pay | Admitting: Orthopedic Surgery

## 2019-09-25 DIAGNOSIS — M67441 Ganglion, right hand: Secondary | ICD-10-CM

## 2019-09-25 DIAGNOSIS — M65341 Trigger finger, right ring finger: Secondary | ICD-10-CM

## 2019-10-04 ENCOUNTER — Other Ambulatory Visit: Payer: Self-pay | Admitting: Orthopedic Surgery

## 2019-10-04 ENCOUNTER — Ambulatory Visit
Admission: RE | Admit: 2019-10-04 | Discharge: 2019-10-04 | Disposition: A | Discharge status: Home / Self Care | Source: Ambulatory Visit | Attending: Orthopedic Surgery | Admitting: Orthopedic Surgery

## 2019-10-04 DIAGNOSIS — M65341 Trigger finger, right ring finger: Secondary | ICD-10-CM

## 2019-10-04 DIAGNOSIS — M67441 Ganglion, right hand: Secondary | ICD-10-CM

## 2019-10-11 ENCOUNTER — Other Ambulatory Visit: Payer: Self-pay | Admitting: Orthopedic Surgery

## 2019-10-25 NOTE — Progress Notes (Signed)
Unable to reach patient to complete pre-op call for upcoming surgery on 2/5 after multiple attempts. Contacted Dr Merrilee Seashore office and lvmm for scheduler Helmut Muster to make aware

## 2019-10-28 ENCOUNTER — Other Ambulatory Visit: Payer: Self-pay

## 2019-10-28 ENCOUNTER — Encounter (HOSPITAL_BASED_OUTPATIENT_CLINIC_OR_DEPARTMENT_OTHER): Payer: Self-pay | Admitting: Orthopedic Surgery

## 2019-10-29 ENCOUNTER — Other Ambulatory Visit: Payer: Self-pay

## 2019-10-29 ENCOUNTER — Other Ambulatory Visit (HOSPITAL_COMMUNITY)
Admission: RE | Admit: 2019-10-29 | Discharge: 2019-10-29 | Disposition: A | Discharge status: Home / Self Care | Source: Ambulatory Visit | Attending: Orthopedic Surgery | Admitting: Orthopedic Surgery

## 2019-10-29 ENCOUNTER — Encounter (HOSPITAL_BASED_OUTPATIENT_CLINIC_OR_DEPARTMENT_OTHER)
Admission: RE | Admit: 2019-10-29 | Discharge: 2019-10-29 | Disposition: A | Discharge status: Home / Self Care | Source: Ambulatory Visit | Attending: Orthopedic Surgery | Admitting: Orthopedic Surgery

## 2019-10-29 DIAGNOSIS — Z01818 Encounter for other preprocedural examination: Secondary | ICD-10-CM | POA: Insufficient documentation

## 2019-10-29 DIAGNOSIS — Z20822 Contact with and (suspected) exposure to covid-19: Secondary | ICD-10-CM | POA: Insufficient documentation

## 2019-10-29 LAB — SARS CORONAVIRUS 2 (TAT 6-24 HRS): SARS Coronavirus 2: NEGATIVE

## 2019-10-29 NOTE — Progress Notes (Signed)

## 2019-11-01 ENCOUNTER — Ambulatory Visit (HOSPITAL_BASED_OUTPATIENT_CLINIC_OR_DEPARTMENT_OTHER): Admitting: Anesthesiology

## 2019-11-01 ENCOUNTER — Encounter (HOSPITAL_BASED_OUTPATIENT_CLINIC_OR_DEPARTMENT_OTHER)
Admission: RE | Disposition: A | Discharge status: Home / Self Care | Payer: Self-pay | Source: Home / Self Care | Attending: Orthopedic Surgery

## 2019-11-01 ENCOUNTER — Ambulatory Visit (HOSPITAL_BASED_OUTPATIENT_CLINIC_OR_DEPARTMENT_OTHER)
Admission: RE | Admit: 2019-11-01 | Discharge: 2019-11-01 | Disposition: A | Discharge status: Home / Self Care | Attending: Orthopedic Surgery | Admitting: Orthopedic Surgery

## 2019-11-01 ENCOUNTER — Other Ambulatory Visit: Payer: Self-pay

## 2019-11-01 ENCOUNTER — Encounter (HOSPITAL_BASED_OUTPATIENT_CLINIC_OR_DEPARTMENT_OTHER): Payer: Self-pay | Admitting: Orthopedic Surgery

## 2019-11-01 DIAGNOSIS — I10 Essential (primary) hypertension: Secondary | ICD-10-CM | POA: Insufficient documentation

## 2019-11-01 DIAGNOSIS — M67441 Ganglion, right hand: Secondary | ICD-10-CM | POA: Insufficient documentation

## 2019-11-01 DIAGNOSIS — M25841 Other specified joint disorders, right hand: Secondary | ICD-10-CM | POA: Diagnosis present

## 2019-11-01 DIAGNOSIS — Z886 Allergy status to analgesic agent status: Secondary | ICD-10-CM | POA: Diagnosis not present

## 2019-11-01 HISTORY — PX: MASS EXCISION: SHX2000

## 2019-11-01 SURGERY — EXCISION MASS
Anesthesia: Monitor Anesthesia Care | Site: Finger | Laterality: Right

## 2019-11-01 MED ORDER — HYDROCODONE-ACETAMINOPHEN 5-325 MG PO TABS: ORAL_TABLET | ORAL | 0 refills | Status: AC

## 2019-11-01 MED ORDER — ONDANSETRON HCL 4 MG/2ML IJ SOLN
INTRAMUSCULAR | Status: DC | PRN
  Administered 2019-11-01: 4 mg via INTRAVENOUS

## 2019-11-01 MED ORDER — MIDAZOLAM HCL 5 MG/5ML IJ SOLN
INTRAMUSCULAR | Status: DC | PRN
  Administered 2019-11-01: 2 mg via INTRAVENOUS

## 2019-11-01 MED ORDER — ONDANSETRON HCL 4 MG/2ML IJ SOLN: 4.0000 mg | Freq: Once | INTRAMUSCULAR | Status: DC | PRN

## 2019-11-01 MED ORDER — OXYCODONE HCL 5 MG/5ML PO SOLN: 5.0000 mg | Freq: Once | ORAL | Status: AC | PRN

## 2019-11-01 MED ORDER — CEFAZOLIN SODIUM-DEXTROSE 2-4 GM/100ML-% IV SOLN
2.0000 g | INTRAVENOUS | Status: AC
  Administered 2019-11-01: 2 g via INTRAVENOUS

## 2019-11-01 MED ORDER — FENTANYL CITRATE (PF) 100 MCG/2ML IJ SOLN: 50.0000 ug | INTRAMUSCULAR | Status: DC | PRN

## 2019-11-01 MED ORDER — KETOROLAC TROMETHAMINE 30 MG/ML IJ SOLN
INTRAMUSCULAR | Status: DC | PRN
  Administered 2019-11-01: 30 mg via INTRAVENOUS

## 2019-11-01 MED ORDER — LIDOCAINE HCL (PF) 1 % IJ SOLN
INTRAMUSCULAR | Status: AC
  Filled 2019-11-01: qty 5

## 2019-11-01 MED ORDER — PROPOFOL 500 MG/50ML IV EMUL
INTRAVENOUS | Status: DC | PRN
  Administered 2019-11-01: 50 ug/kg/min via INTRAVENOUS

## 2019-11-01 MED ORDER — FENTANYL CITRATE (PF) 100 MCG/2ML IJ SOLN
INTRAMUSCULAR | Status: AC
  Filled 2019-11-01: qty 2

## 2019-11-01 MED ORDER — BUPIVACAINE HCL (PF) 0.25 % IJ SOLN
INTRAMUSCULAR | Status: DC | PRN
  Administered 2019-11-01: 4 mL

## 2019-11-01 MED ORDER — FENTANYL CITRATE (PF) 100 MCG/2ML IJ SOLN
INTRAMUSCULAR | Status: DC | PRN
  Administered 2019-11-01: 100 ug via INTRAVENOUS

## 2019-11-01 MED ORDER — FENTANYL CITRATE (PF) 100 MCG/2ML IJ SOLN: 25.0000 ug | INTRAMUSCULAR | Status: DC | PRN

## 2019-11-01 MED ORDER — BUPIVACAINE HCL (PF) 0.25 % IJ SOLN
INTRAMUSCULAR | Status: AC
  Filled 2019-11-01: qty 30

## 2019-11-01 MED ORDER — CHLORHEXIDINE GLUCONATE 4 % EX LIQD: 60.0000 mL | Freq: Once | CUTANEOUS | Status: DC

## 2019-11-01 MED ORDER — 0.9 % SODIUM CHLORIDE (POUR BTL) OPTIME
TOPICAL | Status: DC | PRN
  Administered 2019-11-01: 100 mL

## 2019-11-01 MED ORDER — OXYCODONE HCL 5 MG PO TABS
5.0000 mg | ORAL_TABLET | Freq: Once | ORAL | Status: AC | PRN
  Administered 2019-11-01: 5 mg via ORAL

## 2019-11-01 MED ORDER — CEFAZOLIN SODIUM-DEXTROSE 2-4 GM/100ML-% IV SOLN
INTRAVENOUS | Status: AC
  Filled 2019-11-01: qty 100

## 2019-11-01 MED ORDER — MIDAZOLAM HCL 2 MG/2ML IJ SOLN
INTRAMUSCULAR | Status: AC
  Filled 2019-11-01: qty 2

## 2019-11-01 MED ORDER — OXYCODONE HCL 5 MG PO TABS
ORAL_TABLET | ORAL | Status: AC
  Filled 2019-11-01: qty 1

## 2019-11-01 MED ORDER — LIDOCAINE HCL (PF) 1 % IJ SOLN
INTRAMUSCULAR | Status: DC | PRN
  Administered 2019-11-01: 4 mL

## 2019-11-01 MED ORDER — LACTATED RINGERS IV SOLN: INTRAVENOUS | Status: DC

## 2019-11-01 MED ORDER — ONDANSETRON HCL 4 MG/2ML IJ SOLN
INTRAMUSCULAR | Status: AC
  Filled 2019-11-01: qty 2

## 2019-11-01 MED ORDER — MIDAZOLAM HCL 2 MG/2ML IJ SOLN: 1.0000 mg | INTRAMUSCULAR | Status: DC | PRN

## 2019-11-01 SURGICAL SUPPLY — 50 items
BENZOIN TINCTURE PRP APPL 2/3 (GAUZE/BANDAGES/DRESSINGS) IMPLANT
BLADE MINI RND TIP GREEN BEAV (BLADE) IMPLANT
BLADE SURG 15 STRL LF DISP TIS (BLADE) ×2 IMPLANT
BLADE SURG 15 STRL SS (BLADE) ×4
BNDG COHESIVE 1X5 TAN STRL LF (GAUZE/BANDAGES/DRESSINGS) ×3 IMPLANT
BNDG COHESIVE 2X5 TAN STRL LF (GAUZE/BANDAGES/DRESSINGS) IMPLANT
BNDG CONFORM 2 STRL LF (GAUZE/BANDAGES/DRESSINGS) IMPLANT
BNDG ELASTIC 2X5.8 VLCR STR LF (GAUZE/BANDAGES/DRESSINGS) IMPLANT
BNDG ELASTIC 3X5.8 VLCR STR LF (GAUZE/BANDAGES/DRESSINGS) IMPLANT
BNDG ESMARK 4X9 LF (GAUZE/BANDAGES/DRESSINGS) ×3 IMPLANT
BNDG GAUZE 1X2.1 STRL (MISCELLANEOUS) IMPLANT
BNDG GAUZE ELAST 4 BULKY (GAUZE/BANDAGES/DRESSINGS) IMPLANT
BNDG PLASTER X FAST 3X3 WHT LF (CAST SUPPLIES) IMPLANT
CHLORAPREP W/TINT 26 (MISCELLANEOUS) ×3 IMPLANT
CLOSURE WOUND 1/2 X4 (GAUZE/BANDAGES/DRESSINGS)
CORD BIPOLAR FORCEPS 12FT (ELECTRODE) ×3 IMPLANT
COVER BACK TABLE 60X90IN (DRAPES) ×3 IMPLANT
COVER MAYO STAND STRL (DRAPES) ×3 IMPLANT
COVER WAND RF STERILE (DRAPES) IMPLANT
CUFF TOURN SGL QUICK 18X4 (TOURNIQUET CUFF) ×3 IMPLANT
DRAPE EXTREMITY T 121X128X90 (DISPOSABLE) ×3 IMPLANT
DRAPE SURG 17X23 STRL (DRAPES) ×3 IMPLANT
GAUZE SPONGE 4X4 12PLY STRL (GAUZE/BANDAGES/DRESSINGS) ×3 IMPLANT
GAUZE XEROFORM 1X8 LF (GAUZE/BANDAGES/DRESSINGS) ×3 IMPLANT
GLOVE BIO SURGEON STRL SZ7.5 (GLOVE) ×3 IMPLANT
GLOVE BIOGEL PI IND STRL 8 (GLOVE) ×1 IMPLANT
GLOVE BIOGEL PI INDICATOR 8 (GLOVE) ×2
GOWN STRL REUS W/ TWL LRG LVL3 (GOWN DISPOSABLE) ×1 IMPLANT
GOWN STRL REUS W/TWL LRG LVL3 (GOWN DISPOSABLE) ×2
GOWN STRL REUS W/TWL XL LVL3 (GOWN DISPOSABLE) ×3 IMPLANT
NEEDLE HYPO 25X1 1.5 SAFETY (NEEDLE) ×3 IMPLANT
NS IRRIG 1000ML POUR BTL (IV SOLUTION) ×3 IMPLANT
PACK BASIN DAY SURGERY FS (CUSTOM PROCEDURE TRAY) ×3 IMPLANT
PAD CAST 3X4 CTTN HI CHSV (CAST SUPPLIES) IMPLANT
PAD CAST 4YDX4 CTTN HI CHSV (CAST SUPPLIES) IMPLANT
PADDING CAST ABS 4INX4YD NS (CAST SUPPLIES)
PADDING CAST ABS COTTON 4X4 ST (CAST SUPPLIES) IMPLANT
PADDING CAST COTTON 3X4 STRL (CAST SUPPLIES)
PADDING CAST COTTON 4X4 STRL (CAST SUPPLIES)
STOCKINETTE 4X48 STRL (DRAPES) ×3 IMPLANT
STRIP CLOSURE SKIN 1/2X4 (GAUZE/BANDAGES/DRESSINGS) IMPLANT
SUT ETHILON 3 0 PS 1 (SUTURE) IMPLANT
SUT ETHILON 4 0 PS 2 18 (SUTURE) ×3 IMPLANT
SUT ETHILON 5 0 P 3 18 (SUTURE)
SUT NYLON ETHILON 5-0 P-3 1X18 (SUTURE) IMPLANT
SUT VIC AB 4-0 P2 18 (SUTURE) IMPLANT
SYR BULB 3OZ (MISCELLANEOUS) ×3 IMPLANT
SYR CONTROL 10ML LL (SYRINGE) ×3 IMPLANT
TOWEL GREEN STERILE FF (TOWEL DISPOSABLE) ×6 IMPLANT
UNDERPAD 30X36 HEAVY ABSORB (UNDERPADS AND DIAPERS) ×3 IMPLANT

## 2019-11-01 NOTE — Transfer of Care (Signed)
Immediate Anesthesia Transfer of Care Note  Patient: Kari Webb  Procedure(s) Performed: EXCISION MASS RIGHT RING FINGER (Right Finger)  Patient Location: PACU  Anesthesia Type:MAC  Level of Consciousness: awake, alert  and oriented  Airway & Oxygen Therapy: Patient Spontanous Breathing and Patient connected to face mask oxygen  Post-op Assessment: Report given to RN and Post -op Vital signs reviewed and stable  Post vital signs: Reviewed and stable  Last Vitals:  Vitals Value Taken Time  BP    Temp    Pulse    Resp    SpO2      Last Pain:  Vitals:   11/01/19 1131  TempSrc: Tympanic  PainSc: 0-No pain      Patients Stated Pain Goal: 8 (89/16/94 5038)  Complications: No apparent anesthesia complications

## 2019-11-01 NOTE — Anesthesia Preprocedure Evaluation (Addendum)
Anesthesia Evaluation  Patient identified by MRN, date of birth, ID band Patient awake    Reviewed: Allergy & Precautions, NPO status , Patient's Chart, lab work & pertinent test results  History of Anesthesia Complications Negative for: history of anesthetic complications  Airway Mallampati: II  TM Distance: >3 FB Neck ROM: Full    Dental   Pulmonary neg pulmonary ROS,    Pulmonary exam normal        Cardiovascular hypertension, Normal cardiovascular exam     Neuro/Psych negative neurological ROS  negative psych ROS   GI/Hepatic negative GI ROS, Neg liver ROS,   Endo/Other  negative endocrine ROS  Renal/GU negative Renal ROS  negative genitourinary   Musculoskeletal negative musculoskeletal ROS (+)   Abdominal   Peds  Hematology negative hematology ROS (+)   Anesthesia Other Findings   Reproductive/Obstetrics                            Anesthesia Physical Anesthesia Plan  ASA: II  Anesthesia Plan: MAC   Post-op Pain Management:    Induction: Intravenous  PONV Risk Score and Plan: 2 and Propofol infusion, TIVA and Treatment may vary due to age or medical condition  Airway Management Planned: Natural Airway, Nasal Cannula and Simple Face Mask  Additional Equipment: None  Intra-op Plan:   Post-operative Plan:   Informed Consent: I have reviewed the patients History and Physical, chart, labs and discussed the procedure including the risks, benefits and alternatives for the proposed anesthesia with the patient or authorized representative who has indicated his/her understanding and acceptance.       Plan Discussed with:   Anesthesia Plan Comments:        Anesthesia Quick Evaluation

## 2019-11-01 NOTE — H&P (Addendum)
Kari Webb is an 59 y.o. female.   Chief Complaint: right ring finger mass HPI: 59 yo female with mass right ring finger consistent with annular ligament cyst.  This is painful to her and she wishes to have it removed.  She notes mass palpable at the proximal finger flexion crease and distal into the proximal phalanx as well as proximal to the pip flexion crease. She wishes to have the mass removed at both of these areas.  Allergies:  Allergies  Allergen Reactions  . Asa [Aspirin] Rash    Past Medical History:  Diagnosis Date  . Finger mass, right    long finger  . Hypertension     Past Surgical History:  Procedure Laterality Date  . FINGER SURGERY Left 12/2016  . MASS EXCISION Right 12/21/2017   Procedure: RIGHT LONG FINGER EXPLORATION AND EXCISION MASS;  Surgeon: Betha Loa, MD;  Location: [facility];  Service: Orthopedics;  Laterality: Right;    Family History: History reviewed. No pertinent family history.  Social History:   reports that she has never smoked. She has never used smokeless tobacco. She reports current alcohol use. She reports that she does not use drugs.  Medications: Medications Prior to Admission  Medication Sig Dispense Refill  . cholecalciferol (VITAMIN D3) 25 MCG (1000 UNIT) tablet Take 1,000 Units by mouth daily.    Marland Kitchen olmesartan (BENICAR) 20 MG tablet Take 20 mg by mouth daily.    . vitamin C (ASCORBIC ACID) 250 MG tablet Take 250 mg by mouth daily.    . vitamin E (VITAMIN E) 180 MG (400 UNITS) capsule Take 400 Units by mouth daily.      No results found for this or any previous visit (from the past 48 hour(s)).  No results found.   A comprehensive review of systems was negative.  Blood pressure (!) 144/69, pulse 69, temperature (!) 97.1 F (36.2 C), temperature source Tympanic, resp. rate (!) 22, height 5\' 2"  (1.575 m), weight 75.1 kg, SpO2 99 %.  General appearance: alert, cooperative and appears stated age Head:  Normocephalic, without obvious abnormality, atraumatic Neck: supple, symmetrical, trachea midline Cardio: regular rate and rhythm Resp: clear to auscultation bilaterally Extremities: Intact sensation and capillary refill all digits.  +epl/fpl/io.  No wounds.  Pulses: 2+ and symmetric Skin: Skin color, texture, turgor normal. No rashes or lesions Neurologic: Grossly normal Incision/Wound: none  Assessment/Plan Right ring finger annular ligament cyst.  We discussed that we can remove the mass in both of these locations with the same incision.  Non operative and operative treatment options have been discussed with the patient and patient wishes to proceed with operative treatment. Risks, benefits, and alternatives of surgery have been discussed and the patient agrees with the plan of care.   11/01/2019, 12:56 PM

## 2019-11-01 NOTE — Discharge Instructions (Addendum)
[facility] Instructions Hand Surgery  Wound Care: Keep your hand elevated above the level of your heart.  Do not allow it to dangle by your side.  Keep the dressing dry and do not remove it unless your doctor advises you to do so.  He will usually change it at the time of your post-op visit.  Moving your fingers is advised to stimulate circulation but will depend on the site of your surgery.  If you have a splint applied, your doctor will advise you regarding movement.  Activity: Do not drive or operate machinery today.  Rest today and then you may return to your normal activity and work as indicated by your physician.  Diet:  Drink liquids today or eat a light diet.  You may resume a regular diet tomorrow.    General expectations: Pain for two to three days. Fingers may become slightly swollen.  Call your doctor if any of the following occur: Severe pain not relieved by pain medication. Elevated temperature. Dressing soaked with blood. Inability to move fingers. White or bluish color to fingers.   No ibuprofen before 10:30pm   Post Anesthesia Home Care Instructions  Activity: Get plenty of rest for the remainder of the day. A responsible individual must stay with you for 24 hours following the procedure.  For the next 24 hours, DO NOT: -Drive a car -Advertising copywriter -Drink alcoholic beverages -Take any medication unless instructed by your physician -Make any legal decisions or sign important papers.  Meals: Start with liquid foods such as gelatin or soup. Progress to regular foods as tolerated. Avoid greasy, spicy, heavy foods. If nausea and/or vomiting occur, drink only clear liquids until the nausea and/or vomiting subsides. Call your physician if vomiting continues.  Special Instructions/Symptoms: Your throat may feel dry or sore from the anesthesia or the breathing tube placed in your throat during surgery. If this causes discomfort, gargle with warm salt water. The  discomfort should disappear within 24 hours.  If you had a scopolamine patch placed behind your ear for the management of post- operative nausea and/or vomiting:  1. The medication in the patch is effective for 72 hours, after which it should be removed.  Wrap patch in a tissue and discard in the trash. Wash hands thoroughly with soap and water. 2. You may remove the patch earlier than 72 hours if you experience unpleasant side effects which may include dry mouth, dizziness or visual disturbances. 3. Avoid touching the patch. Wash your hands with soap and water after contact with the patch.  May take another pain pill at 9:30PM tonight.

## 2019-11-01 NOTE — Op Note (Signed)
NAME: Kari Webb MEDICAL RECORD NO: [id number] DATE OF BIRTH: June 12, 1961 FACILITY: [facility] LOCATION: [facility] PHYSICIAN: Tami Ribas, MD   OPERATIVE REPORT   DATE OF PROCEDURE: 11/01/19    PREOPERATIVE DIAGNOSIS:  Right ring finger annular ligament cyst   POSTOPERATIVE DIAGNOSIS:   Right ring finger subcutaneous fibrosis   PROCEDURE:   Right ring finger excision of fibrous mass approximately 5 mm   SURGEON:  Betha Loa, M.D.   ASSISTANT: none   ANESTHESIA:  Local with sedation   INTRAVENOUS FLUIDS:  Per anesthesia flow sheet.   ESTIMATED BLOOD LOSS:  Minimal.   COMPLICATIONS:  None.   SPECIMENS:   Ring finger mass to pathology   TOURNIQUET TIME:    Total Tourniquet Time Documented: Upper Arm (Right) - 27 minutes Total: Upper Arm (Right) - 27 minutes    DISPOSITION:  Stable to PACU.   INDICATIONS: 59 year old female with palpable mass of the right ring finger.  This is present at both the MP flexion crease and PIP flexion crease.  It is bothersome to her.  She wishes to have it removed. Risks, benefits and alternatives of surgery were discussed including the risks of blood loss, infection, damage to nerves, vessels, tendons, ligaments, bone for surgery, need for additional surgery, complications with wound healing, continued pain, stiffness, recurrence of mass.  She voiced understanding of these risks and elected to proceed.  OPERATIVE COURSE:  After being identified preoperatively by myself,  the patient and I agreed on the procedure and site of the procedure.  The surgical site was marked.  Surgical consent had been signed. She was given IV antibiotics as preoperative antibiotic prophylaxis. She was transferred to the operating room and placed on the operating table in supine position with the Right upper extremity on an arm board.  Sedation was induced by the anesthesiologist.  Surgical pause was performed between surgeons anesthesia and operating  staff and our agreement space procedure and safety.  Digital block was performed to the right ring finger with 8 cc of half-and-half solution 1% plain lidocaine and quarter percent plain Marcaine.  This is adequate to give digital anesthesia.  Right upper extremity was prepped and draped in normal sterile orthopedic fashion.  A surgical pause was performed between the surgeons, anesthesia, and operating room staff and all were in agreement as to the patient, procedure, and site of procedure.  Tourniquet at the proximal aspect of the extremity was inflated to 250 mmHg after exsanguination of the arm with an Esmarch bandage.    A Bruner type incision was made over the proximal phalanx and over the MP joint.  This is carried in subcutaneous tissues by spreading technique.  There was thickened fibrous tissue overlying the proximal finger flexion crease in the area of the palpable mass.  No annular ligament cyst was noted.  The radial and ulnar digital nerve and artery were identified and protected throughout the case.  The fibrous tissue was excised and sent to pathology.  In the area of the palpable mass at the level of the PIP flexion crease again there was thickened fibrous tissue which was removed and sent to pathology.  The volar condyles of the proximal phalanx were more palpable.  Again no annular ligament cyst was noted.  The flexor sheath appeared intact in both locations.  The area was palpated both underneath and above the skin and no palpable mass was noted.  The wound was copiously irrigated with sterile saline and closed with  4-0 nylon in a horizontal mattress fashion.  It was dressed with sterile Xeroform 4 x 4's and wrapped with a Coban dressing lightly.  The the tourniquet was deflated at 27 minutes.  Fingertips were pink with brisk capillary refill after deflation of tourniquet.  The operative  drapes were broken down.  The patient was awoken from anesthesia safely.  She was transferred back to the  stretcher and taken to PACU in stable condition.  I will see her back in the office in 1 week for postoperative followup.  I will give her a prescription for Norco 5/325 1-2 tabs PO q6 hours prn pain, dispense # 20.   Kari Cover, MD Electronically signed, 11/01/19

## 2019-11-04 LAB — SURGICAL PATHOLOGY

## 2019-11-04 NOTE — Anesthesia Postprocedure Evaluation (Signed)
Anesthesia Post Note  Patient: Roselia Snipe  Procedure(s) Performed: EXCISION MASS RIGHT RING FINGER (Right Finger)     Patient location during evaluation: PACU Anesthesia Type: MAC Level of consciousness: awake and alert Pain management: pain level controlled Vital Signs Assessment: post-procedure vital signs reviewed and stable Respiratory status: spontaneous breathing, nonlabored ventilation and respiratory function stable Cardiovascular status: blood pressure returned to baseline and stable Postop Assessment: no apparent nausea or vomiting Anesthetic complications: no    Last Vitals:  Vitals:   11/01/19 1500 11/01/19 1533  BP: 138/77 130/76  Pulse: (!) 53 (!) 53  Resp: 14 18  Temp:  36.7 C  SpO2: 97% 97%    Last Pain:  Vitals:   11/01/19 1533  TempSrc: Oral  PainSc: 5                  Lidia Collum

## 2019-11-05 ENCOUNTER — Encounter: Payer: Self-pay | Admitting: *Deleted

## 2020-12-16 ENCOUNTER — Other Ambulatory Visit: Payer: Self-pay | Admitting: Family Medicine

## 2020-12-16 DIAGNOSIS — G44309 Post-traumatic headache, unspecified, not intractable: Secondary | ICD-10-CM

## 2020-12-17 ENCOUNTER — Other Ambulatory Visit: Payer: Self-pay

## 2020-12-17 ENCOUNTER — Ambulatory Visit
Admission: RE | Admit: 2020-12-17 | Discharge: 2020-12-17 | Disposition: A | Discharge status: Home / Self Care | Payer: Worker's Compensation | Source: Ambulatory Visit | Attending: Family Medicine | Admitting: Family Medicine

## 2020-12-17 DIAGNOSIS — G44309 Post-traumatic headache, unspecified, not intractable: Secondary | ICD-10-CM

## 2021-06-03 ENCOUNTER — Emergency Department (HOSPITAL_BASED_OUTPATIENT_CLINIC_OR_DEPARTMENT_OTHER): Payer: BC Managed Care – PPO

## 2021-06-03 ENCOUNTER — Emergency Department (HOSPITAL_BASED_OUTPATIENT_CLINIC_OR_DEPARTMENT_OTHER)
Admission: EM | Admit: 2021-06-03 | Discharge: 2021-06-03 | Disposition: A | Discharge status: Home / Self Care | Payer: BC Managed Care – PPO | Attending: Emergency Medicine | Admitting: Emergency Medicine

## 2021-06-03 ENCOUNTER — Other Ambulatory Visit: Payer: Self-pay

## 2021-06-03 ENCOUNTER — Encounter (HOSPITAL_BASED_OUTPATIENT_CLINIC_OR_DEPARTMENT_OTHER): Payer: Self-pay

## 2021-06-03 DIAGNOSIS — S161XXA Strain of muscle, fascia and tendon at neck level, initial encounter: Secondary | ICD-10-CM | POA: Diagnosis not present

## 2021-06-03 DIAGNOSIS — Z79899 Other long term (current) drug therapy: Secondary | ICD-10-CM | POA: Insufficient documentation

## 2021-06-03 DIAGNOSIS — M545 Low back pain, unspecified: Secondary | ICD-10-CM | POA: Insufficient documentation

## 2021-06-03 DIAGNOSIS — Y92410 Unspecified street and highway as the place of occurrence of the external cause: Secondary | ICD-10-CM | POA: Insufficient documentation

## 2021-06-03 DIAGNOSIS — M25511 Pain in right shoulder: Secondary | ICD-10-CM | POA: Insufficient documentation

## 2021-06-03 DIAGNOSIS — M25512 Pain in left shoulder: Secondary | ICD-10-CM | POA: Insufficient documentation

## 2021-06-03 DIAGNOSIS — I10 Essential (primary) hypertension: Secondary | ICD-10-CM | POA: Insufficient documentation

## 2021-06-03 DIAGNOSIS — S199XXA Unspecified injury of neck, initial encounter: Secondary | ICD-10-CM | POA: Diagnosis present

## 2021-06-03 MED ORDER — LIDOCAINE 5 % EX PTCH: 1.0000 | MEDICATED_PATCH | CUTANEOUS | 0 refills | Status: AC

## 2021-06-03 MED ORDER — METHOCARBAMOL 500 MG PO TABS: 500.0000 mg | ORAL_TABLET | Freq: Two times a day (BID) | ORAL | 0 refills | Status: AC

## 2021-06-03 MED ORDER — KETOROLAC TROMETHAMINE 30 MG/ML IJ SOLN
30.0000 mg | Freq: Once | INTRAMUSCULAR | Status: AC
  Administered 2021-06-03: 30 mg via INTRAMUSCULAR
  Filled 2021-06-03: qty 1
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
## 2022-04-12 IMAGING — CT CT HEAD W/O CM
1 series · 15 of 30 positions shown, 19 images · non-contrast
Comparison: Head CT 09/08/2018.

CLINICAL DATA: Posttraumatic headache, not intractable, unspecified
chronicity pattern. Additional history provided by scanning
technologist: Patient reports fall hitting head on left side,
headaches and dizziness.

EXAM:
CT HEAD WITHOUT CONTRAST
TECHNIQUE: Contiguous axial images were obtained from the base of the skull
through the vertex without intravenous contrast.

[Series 2: head w/(date) · axial · 0.44mm/px · z∈[-188,-32]mm · 15 of 35 slices shown, 19 images]
[im 2/35  brain]
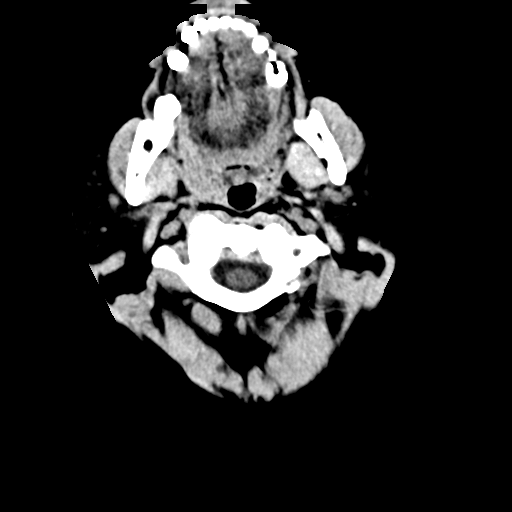
[im 2/35  bone]
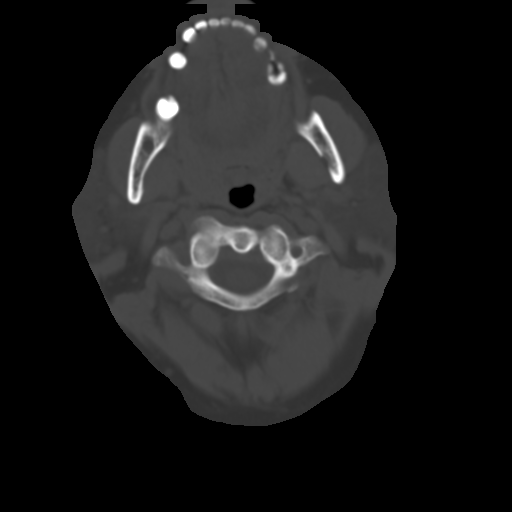
[im 4/35  brain]
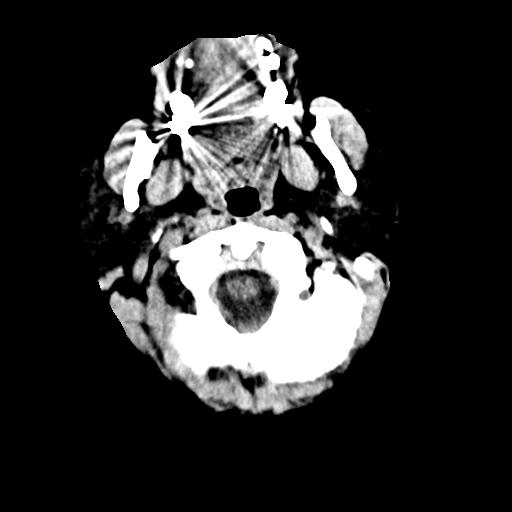
[im 6/35  brain]
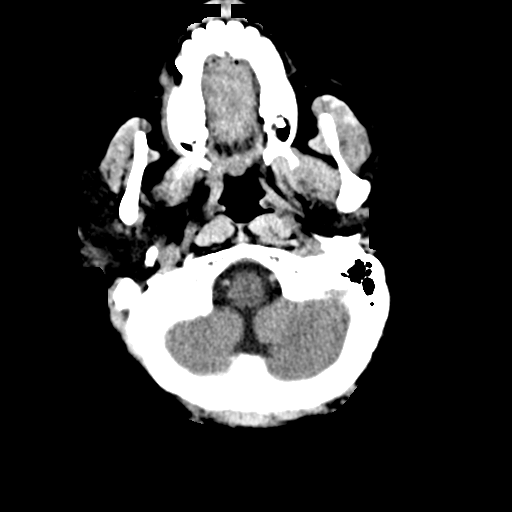
[im 9/35  brain]
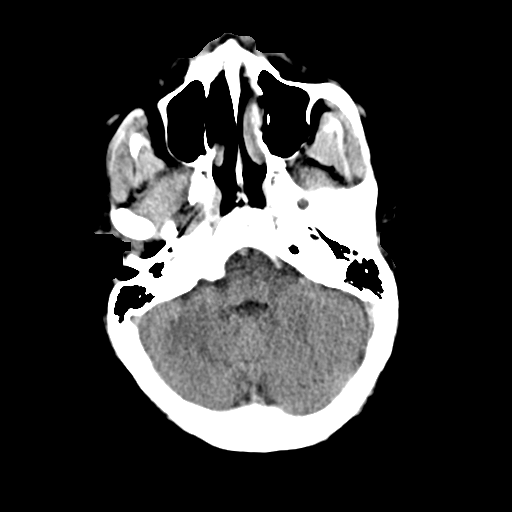
[im 11/35  brain]
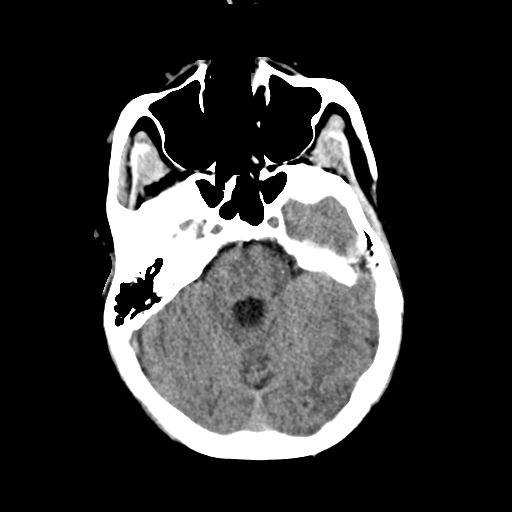
[im 11/35  bone]
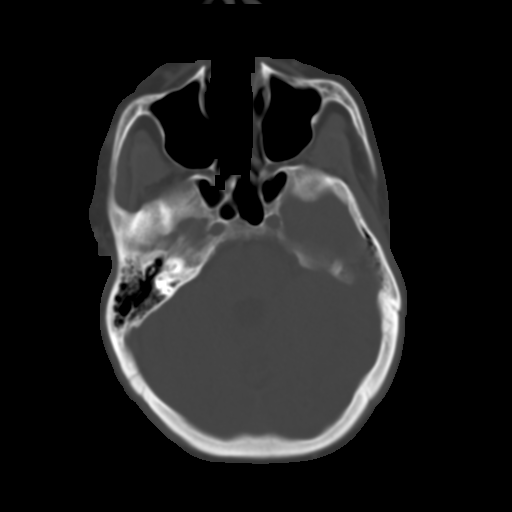
[im 13/35  brain]
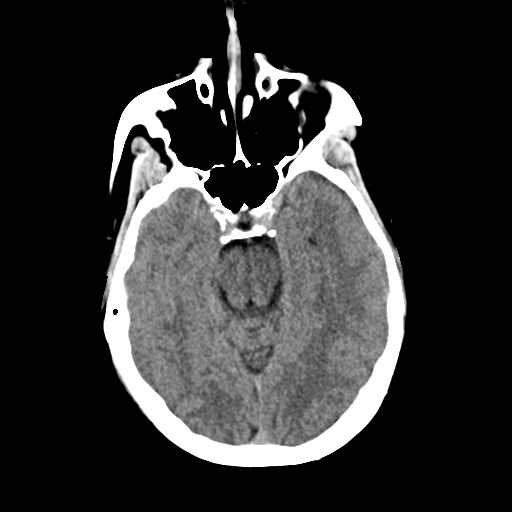
[im 16/35  brain]
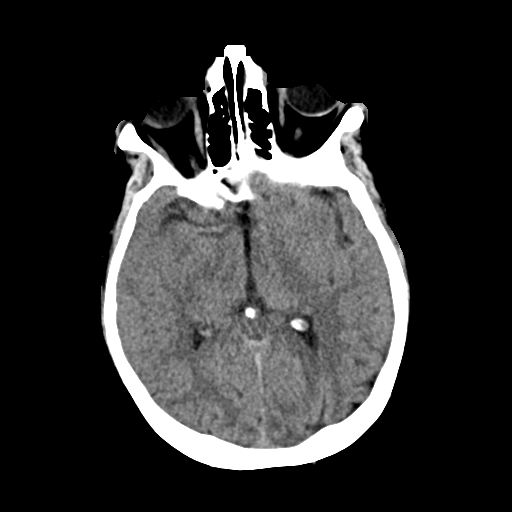
[im 18/35  brain]
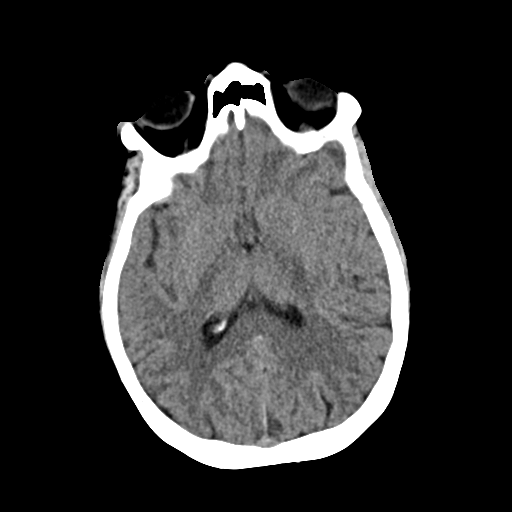
[im 19/35  brain]
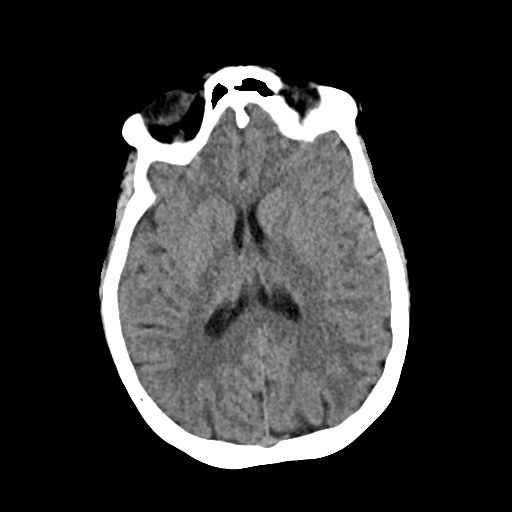
[im 19/35  bone]
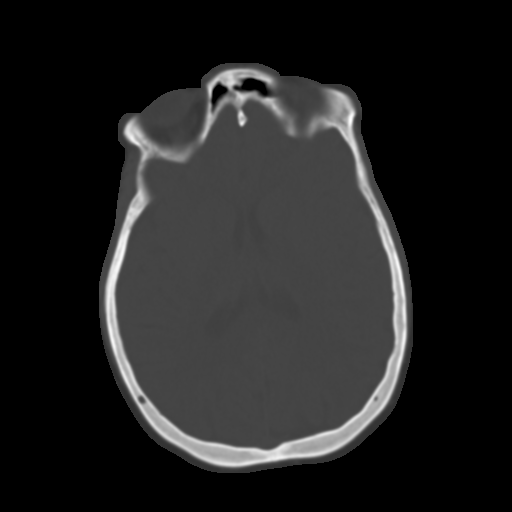
[im 22/35  brain]
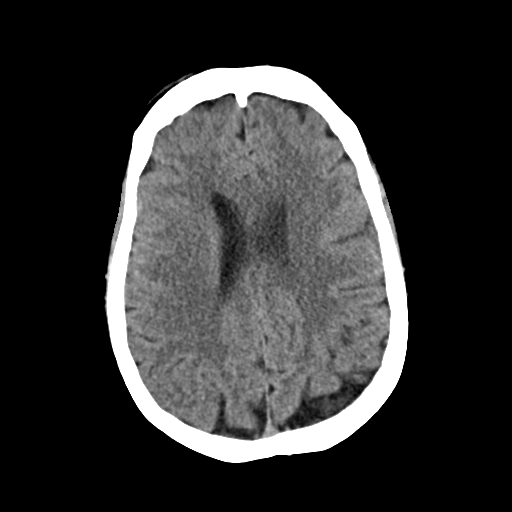
[im 24/35  brain]
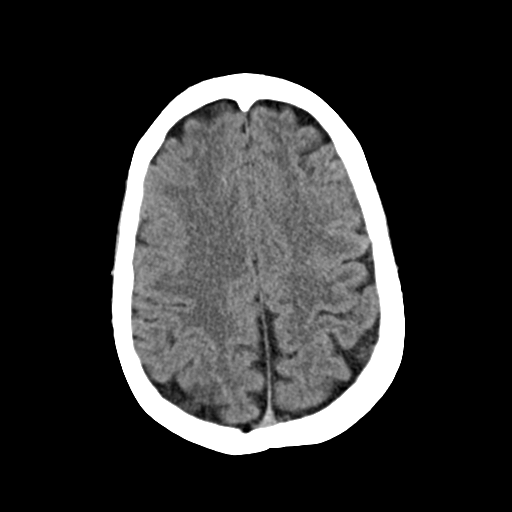
[im 26/35  brain]
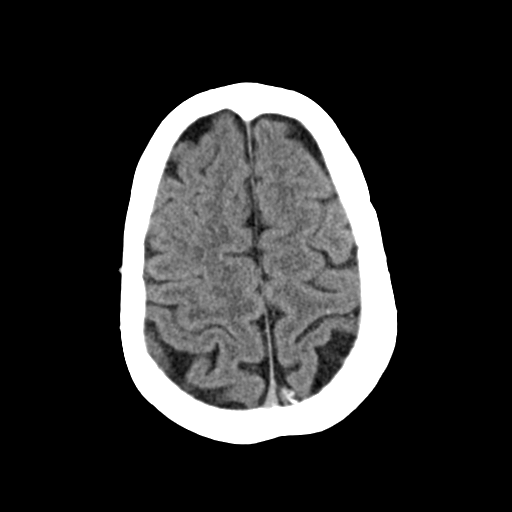
[im 29/35  brain]
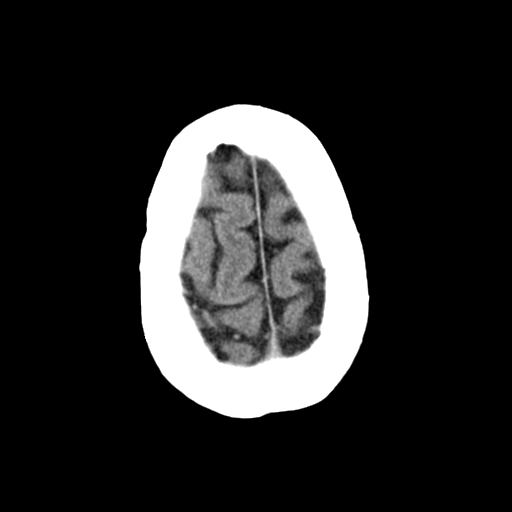
[im 29/35  bone]
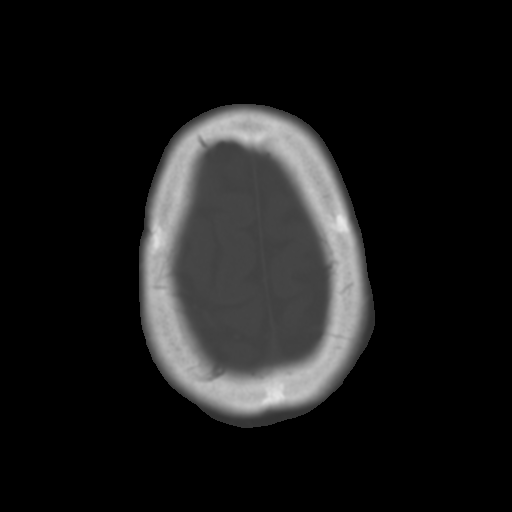
[im 31/35  brain]
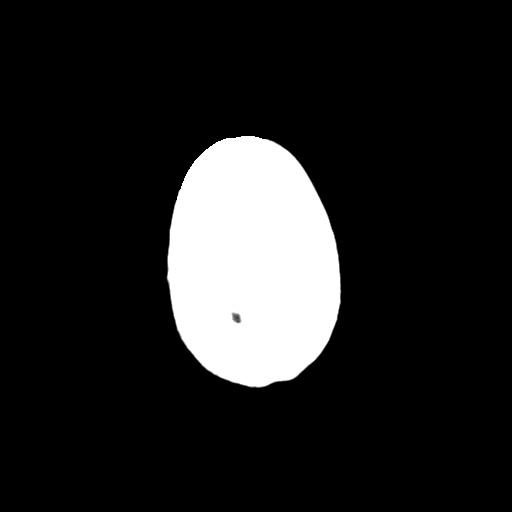
[im 33/35  brain]
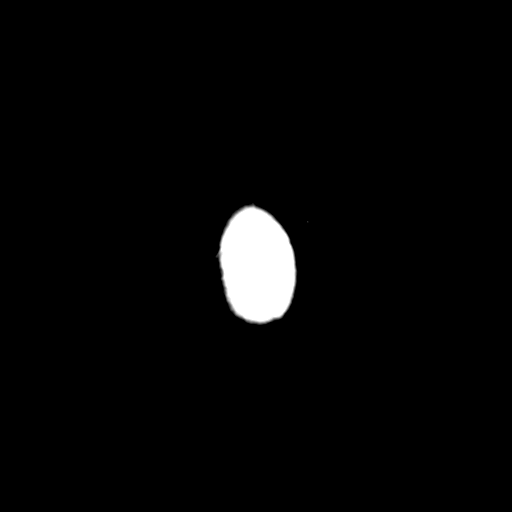

[15 of 30 positions shown; findings below may reference images not displayed]

FINDINGS: Brain:

Cerebral volume is normal.

There is no acute intracranial hemorrhage.

No demarcated cortical infarct.

No extra-axial fluid collection.

No evidence of intracranial mass.

No midline shift.

Partially empty sella turcica.

Vascular: No hyperdense vessel.  Atherosclerotic calcifications

Skull: Normal. Negative for fracture or focal lesion.

Sinuses/Orbits: Visualized orbits show no acute finding. No
significant paranasal sinus disease at the imaged levels.
IMPRESSION: No evidence of acute intracranial abnormality.

Partially empty sella turcica. This finding is very commonly
incidental, but can be associated with idiopathic intracranial
hypertension.

## 2022-07-13 ENCOUNTER — Encounter (HOSPITAL_BASED_OUTPATIENT_CLINIC_OR_DEPARTMENT_OTHER): Payer: Self-pay | Admitting: Emergency Medicine

## 2022-07-13 ENCOUNTER — Emergency Department (HOSPITAL_BASED_OUTPATIENT_CLINIC_OR_DEPARTMENT_OTHER): Payer: BC Managed Care – PPO

## 2022-07-13 ENCOUNTER — Emergency Department (HOSPITAL_BASED_OUTPATIENT_CLINIC_OR_DEPARTMENT_OTHER)
Admission: EM | Admit: 2022-07-13 | Discharge: 2022-07-13 | Disposition: A | Discharge status: Home / Self Care | Payer: BC Managed Care – PPO | Attending: Emergency Medicine | Admitting: Emergency Medicine

## 2022-07-13 DIAGNOSIS — W19XXXA Unspecified fall, initial encounter: Secondary | ICD-10-CM

## 2022-07-13 DIAGNOSIS — M25552 Pain in left hip: Secondary | ICD-10-CM | POA: Insufficient documentation

## 2022-07-13 DIAGNOSIS — I10 Essential (primary) hypertension: Secondary | ICD-10-CM | POA: Insufficient documentation

## 2022-07-13 DIAGNOSIS — W010XXA Fall on same level from slipping, tripping and stumbling without subsequent striking against object, initial encounter: Secondary | ICD-10-CM | POA: Insufficient documentation

## 2022-07-13 MED ORDER — CELECOXIB 200 MG PO CAPS: 200.0000 mg | ORAL_CAPSULE | Freq: Two times a day (BID) | ORAL | 0 refills | Status: AC

## 2022-07-13 NOTE — Discharge Instructions (Addendum)
You were seen in the emergency department for fall and hip pain.  As we discussed your x-ray did not show any broken bones.  I think that you likely strained a muscle in your left leg.  I have sent a prescription for a stronger anti-inflammatory to your pharmacy.  If you feel you need additional pain medicine, you can take some Tylenol as well.  I have attached the contact information for the orthopedic doctor, for you to follow-up with if your symptoms do not improve.  Continue to monitor how you're doing and return to the ER for new or worsening symptoms.

## 2022-07-13 NOTE — ED Provider Notes (Signed)
[facility] EMERGENCY DEPARTMENT Provider Note   CSN: [id number] Arrival date & time: 07/13/22  1609     History  Chief Complaint  Patient presents with   Fall   Hip Pain    Kari Webb is a 61 y.o. female with history of hypertension who presents the emergency department complaining of left hip pain.  Patient states that she was at work when when she slipped and fell on her left knee.  Since then she has been having pain in her left hip and left upper leg.  She has been trying ibuprofen with minimal relief.  No other trauma.  Did not hit her head.  No numbness or back pain.   Fall  Hip Pain       Home Medications Prior to Admission medications   Medication Sig Start Date End Date Taking? Authorizing Provider  celecoxib (CELEBREX) 200 MG capsule Take 1 capsule (200 mg total) by mouth 2 (two) times daily. 07/13/22  Yes Voncile Schwarz T, PA-C  cholecalciferol (VITAMIN D3) 25 MCG (1000 UNIT) tablet Take 1,000 Units by mouth daily.    [provider]  HYDROcodone-acetaminophen (NORCO) 5-325 MG tablet 1-2 tabs po q6 hours prn pain 11/01/19   Betha Loa, MD  lidocaine (LIDODERM) 5 % Place 1 patch onto the skin daily. Remove & Discard patch within 12 hours or as directed by MD 06/03/21   Loeffler, Finis Bud, PA-C  methocarbamol (ROBAXIN) 500 MG tablet Take 1 tablet (500 mg total) by mouth 2 (two) times daily. 06/03/21   Loeffler, Finis Bud, PA-C  olmesartan (BENICAR) 20 MG tablet Take 20 mg by mouth daily.    [provider]  vitamin C (ASCORBIC ACID) 250 MG tablet Take 250 mg by mouth daily.    [provider]  vitamin E (VITAMIN E) 180 MG (400 UNITS) capsule Take 400 Units by mouth daily.    [provider]      Allergies    Amlodipine, Hydrochlorothiazide, Hydrocodone-acetaminophen, Valsartan, and Asa [aspirin]    Review of Systems   Review of Systems  Musculoskeletal:  Positive for arthralgias. Negative for back pain and myalgias.   Neurological:  Negative for weakness and numbness.  All other systems reviewed and are negative.   Physical Exam Updated Vital Signs BP (!) 144/75 (BP Location: Left Arm)   Pulse 65   Temp 98.3 F (36.8 C) (Oral)   Resp 18   Ht 5\' 2"  (1.575 m)   Wt 72.6 kg   SpO2 99%   BMI 29.26 kg/m  Physical Exam Vitals and nursing note reviewed.  Constitutional:      Appearance: Normal appearance.  HENT:     Head: Normocephalic and atraumatic.  Eyes:     Conjunctiva/sclera: Conjunctivae normal.  Pulmonary:     Effort: Pulmonary effort is normal. No respiratory distress.  Musculoskeletal:     Comments: Generalized tenderness to palpation over the left hip, without bony tenderness or deformities.  Compartments of the left leg are soft.  Normal distal pulses, sensation intact.  Skin:    General: Skin is warm and dry.  Neurological:     Mental Status: She is alert.  Psychiatric:        Mood and Affect: Mood normal.        Behavior: Behavior normal.     ED Results / Procedures / Treatments   Labs (all labs ordered are listed, but only abnormal results are displayed) Labs Reviewed - No data to display  EKG None  Radiology DG Hip Unilat W or Wo Pelvis 2-3 Views Left  Result Date: 07/13/2022 CLINICAL DATA:  Left hip pain following mechanical fall EXAM: DG HIP (WITH OR WITHOUT PELVIS) 3V LEFT COMPARISON:  None Available. FINDINGS: There is no evidence of hip fracture or dislocation. There is no evidence of arthropathy or other focal bone abnormality. IMPRESSION: No acute fracture or dislocation. Electronically Signed   By: Darrin Nipper M.D.   On: 07/13/2022 16:37    Procedures Procedures    Medications Ordered in ED Medications - No data to display  ED Course/ Medical Decision Making/ A&P                           Medical Decision Making Amount and/or Complexity of Data Reviewed Radiology: ordered.  This patient is a 61 y.o. female  who presents to the ED for concern of fall  and hip pain.   Past Medical History / Co-morbidities: HTN  Physical Exam: Physical exam performed. The pertinent findings include: Normal vital signs.   Lab Tests/Imaging studies: I personally interpreted labs/imaging and the pertinent results include:  x-ray of left hip shows no fractures or dislocations. I agree with the radiologist interpretation.  Disposition: After consideration of the diagnostic results and the patients response to treatment, I feel that emergency department workup does not suggest an emergent condition requiring admission or immediate intervention beyond what has been performed at this time. The plan is: Discharged home with prescription for Celebrex and recommendation for symptomatic management of likely left upper leg muscle strain.  Given orthopedic follow-up if symptoms persist.  The patient is safe for discharge and has been instructed to return immediately for worsening symptoms, change in symptoms or any other concerns.   Final Clinical Impression(s) / ED Diagnoses Final diagnoses:  Fall, initial encounter  Left hip pain    Rx / DC Orders ED Discharge Orders          Ordered    celecoxib (CELEBREX) 200 MG capsule  2 times daily        07/13/22 1730           Portions of this report may have been transcribed using voice recognition software. Every effort was made to ensure accuracy; however, inadvertent computerized transcription errors may be present.    Estill Cotta 07/13/22 1757    Drenda Freeze, MD 07/13/22 [phone]

## 2022-07-13 NOTE — ED Triage Notes (Signed)
Mechanical fall on Friday. C/o left hip pain that radiates down lateral side of left thigh. Pt ambulatory with steady gait.

## 2023-10-26 ENCOUNTER — Encounter (HOSPITAL_BASED_OUTPATIENT_CLINIC_OR_DEPARTMENT_OTHER): Payer: Self-pay | Admitting: Emergency Medicine

## 2023-10-26 ENCOUNTER — Emergency Department (HOSPITAL_BASED_OUTPATIENT_CLINIC_OR_DEPARTMENT_OTHER): Payer: Self-pay

## 2023-10-26 ENCOUNTER — Other Ambulatory Visit: Payer: Self-pay

## 2023-10-26 ENCOUNTER — Emergency Department (HOSPITAL_BASED_OUTPATIENT_CLINIC_OR_DEPARTMENT_OTHER)
Admission: EM | Admit: 2023-10-26 | Discharge: 2023-10-26 | Disposition: A | Discharge status: Home / Self Care | Payer: Self-pay | Attending: Emergency Medicine | Admitting: Emergency Medicine

## 2023-10-26 DIAGNOSIS — I10 Essential (primary) hypertension: Secondary | ICD-10-CM | POA: Insufficient documentation

## 2023-10-26 DIAGNOSIS — J111 Influenza due to unidentified influenza virus with other respiratory manifestations: Secondary | ICD-10-CM | POA: Insufficient documentation

## 2023-10-26 DIAGNOSIS — H9202 Otalgia, left ear: Secondary | ICD-10-CM | POA: Insufficient documentation

## 2023-10-26 DIAGNOSIS — Z79899 Other long term (current) drug therapy: Secondary | ICD-10-CM | POA: Diagnosis not present

## 2023-10-26 DIAGNOSIS — R000 Tachycardia, unspecified: Secondary | ICD-10-CM | POA: Diagnosis not present

## 2023-10-26 DIAGNOSIS — Z20822 Contact with and (suspected) exposure to covid-19: Secondary | ICD-10-CM | POA: Diagnosis not present

## 2023-10-26 DIAGNOSIS — R0981 Nasal congestion: Secondary | ICD-10-CM | POA: Diagnosis present

## 2023-10-26 LAB — RESP PANEL BY RT-PCR (RSV, FLU A&B, COVID)  RVPGX2
Influenza A by PCR: POSITIVE — AB
Influenza B by PCR: NEGATIVE
Resp Syncytial Virus by PCR: NEGATIVE
SARS Coronavirus 2 by RT PCR: NEGATIVE

## 2023-10-26 LAB — GROUP A STREP BY PCR: Group A Strep by PCR: NOT DETECTED

## 2023-10-26 MED ORDER — ACETAMINOPHEN 325 MG PO TABS
650.0000 mg | ORAL_TABLET | Freq: Once | ORAL | Status: AC
  Administered 2023-10-26: 650 mg via ORAL
  Filled 2023-10-26: qty 2

## 2023-10-26 MED ORDER — IBUPROFEN 800 MG PO TABS
800.0000 mg | ORAL_TABLET | Freq: Once | ORAL | Status: AC
  Administered 2023-10-26: 800 mg via ORAL
  Filled 2023-10-26: qty 1

## 2023-10-26 MED ORDER — OSELTAMIVIR PHOSPHATE 75 MG PO CAPS: 75.0000 mg | ORAL_CAPSULE | Freq: Two times a day (BID) | ORAL | 0 refills | Status: AC

## 2023-10-26 NOTE — ED Notes (Signed)
Pt took 1000mg  tylenol at 0200

## 2023-10-26 NOTE — Discharge Instructions (Addendum)
Keep yourself hydrated.  Use Tylenol or Motrin as needed for aches and fever.  Follow-up with your doctor for recheck.  Return to the ED if exertional chest pain, pain associated shortness of breath, nausea, vomiting, not able to eat or drink or any other concerns.

## 2023-10-26 NOTE — ED Provider Notes (Signed)
[place] EMERGENCY DEPARTMENT AT [facility] Provider Note   CSN: [id number] Arrival date & time: 10/26/23  0424     History  Chief Complaint  Patient presents with   URI    Kari Webb is a 63 y.o. female.  Patient with a history of hypertension presenting with a 2-day history of cough, ear congestion, chest pain and back pain with coughing, headache, fever.  States her sister has been sick as well.  Found to be febrile and tachycardic in the Neck on arrival.  Did take Tylenol at 2 AM.  Cough is nonproductive associate with sore throat and congestion.  Has chest pain only with coughing.  Hurts in her upper back as well when she coughs.  Denies shortness of breath.  Denies any nausea, vomiting, abdominal pain, diarrhea.  Has gradual onset headache as well as left ear pain as well.  Comes in now because she cannot sleep.  Denies any cardiac or pulmonary history.  Does not smoke.  The history is provided by the patient.  URI Presenting symptoms: congestion, cough, fatigue, fever and sore throat   Associated symptoms: arthralgias, headaches and myalgias        Home Medications Prior to Admission medications   Medication Sig Start Date End Date Taking? Authorizing Provider  celecoxib (CELEBREX) 200 MG capsule Take 1 capsule (200 mg total) by mouth 2 (two) times daily. 07/13/22   Roemhildt, Lorin T, PA-C  cholecalciferol (VITAMIN D3) 25 MCG (1000 UNIT) tablet Take 1,000 Units by mouth daily.    [provider]  HYDROcodone-acetaminophen (NORCO) 5-325 MG tablet 1-2 tabs po q6 hours prn pain 11/01/19   Betha Loa, MD  lidocaine (LIDODERM) 5 % Place 1 patch onto the skin daily. Remove & Discard patch within 12 hours or as directed by MD 06/03/21   Loeffler, Finis Bud, PA-C  methocarbamol (ROBAXIN) 500 MG tablet Take 1 tablet (500 mg total) by mouth 2 (two) times daily. 06/03/21   Loeffler, Finis Bud, PA-C  olmesartan (BENICAR) 20 MG tablet Take 20 mg by mouth daily.     [provider]  vitamin C (ASCORBIC ACID) 250 MG tablet Take 250 mg by mouth daily.    [provider]  vitamin E (VITAMIN E) 180 MG (400 UNITS) capsule Take 400 Units by mouth daily.    [provider]      Allergies    Amlodipine, Hydrochlorothiazide, Hydrocodone-acetaminophen, Valsartan, and Asa [aspirin]    Review of Systems   Review of Systems  Constitutional:  Positive for fatigue and fever. Negative for activity change and appetite change.  HENT:  Positive for congestion and sore throat.   Respiratory:  Positive for cough and chest tightness.   Cardiovascular:  Positive for chest pain.  Gastrointestinal:  Negative for abdominal pain, nausea and vomiting.  Genitourinary:  Negative for dysuria, hematuria, urgency and vaginal pain.  Musculoskeletal:  Positive for arthralgias and myalgias.  Neurological:  Positive for headaches.   all other systems are negative except as noted in the HPI and PMH.     Physical Exam Updated Vital Signs BP (!) 155/80 (BP Location: Right Arm)   Pulse (!) 105   Temp (!) 103.1 F (39.5 C) (Oral)   Resp (!) 28   Ht 5\' 2"  (1.575 m)   Wt 77.1 kg   SpO2 97%   BMI 31.09 kg/m  Physical Exam Vitals and nursing note reviewed.  Constitutional:      General: She is not in  acute distress.    Appearance: She is well-developed. She is ill-appearing.     Comments: Ill-appearing but nontoxic  HENT:     Head: Normocephalic and atraumatic.     Right Ear: Tympanic membrane normal.     Left Ear: Tympanic membrane normal.     Mouth/Throat:     Pharynx: No oropharyngeal exudate.     Comments: Uvula midline, no asymmetry or significant edema Eyes:     Conjunctiva/sclera: Conjunctivae normal.     Pupils: Pupils are equal, round, and reactive to light.  Neck:     Comments: No meningismus. Cardiovascular:     Rate and Rhythm: Regular rhythm. Tachycardia present.     Heart sounds: Normal heart sounds. No murmur heard. Pulmonary:      Effort: Pulmonary effort is normal. No respiratory distress.     Breath sounds: Normal breath sounds. No wheezing.  Abdominal:     Palpations: Abdomen is soft.     Tenderness: There is no abdominal tenderness. There is no guarding or rebound.  Musculoskeletal:        General: No tenderness. Normal range of motion.     Cervical back: Normal range of motion and neck supple.  Skin:    General: Skin is warm.  Neurological:     Mental Status: She is alert and oriented to person, place, and time.     Cranial Nerves: No cranial nerve deficit.     Motor: No abnormal muscle tone.     Coordination: Coordination normal.     Comments:  5/5 strength throughout. CN 2-12 intact.Equal grip strength.   Psychiatric:        Behavior: Behavior normal.     ED Results / Procedures / Treatments   Labs (all labs ordered are listed, but only abnormal results are displayed) Labs Reviewed  RESP PANEL BY RT-PCR (RSV, FLU A&B, COVID)  RVPGX2 - Abnormal; Notable for the following components:      Result Value   Influenza A by PCR POSITIVE (*)    All other components within normal limits  GROUP A STREP BY PCR    EKG EKG Interpretation Date/Time:  Thursday October 26 2023 05:59:56 EST Ventricular Rate:  99 PR Interval:  131 QRS Duration:  96 QT Interval:  338 QTC Calculation: 434 R Axis:   25  Text Interpretation: Sinus rhythm Nonspecific repol abnormality, lateral leads Baseline wander in lead(s) V4 V5 V6 Nonspecific T wave abnormality Confirmed by Glynn Octave [phone]) on 10/26/2023 6:13:02 AM  Radiology DG Chest 2 View Result Date: 10/26/2023 CLINICAL DATA:  63 year old female with history of cough and fever. EXAM: CHEST - 2 VIEW COMPARISON:  None Available. FINDINGS: Lung volumes are low. No consolidative airspace disease. No pleural effusions. No pneumothorax. No pulmonary nodule or mass noted. Pulmonary vasculature and the cardiomediastinal silhouette are within normal limits. IMPRESSION:  1. Low lung volumes without radiographic evidence of acute cardiopulmonary disease. Electronically Signed   By: Trudie Reed M.D.   On: 10/26/2023 06:13    Procedures Procedures    Medications Ordered in ED Medications  ibuprofen (ADVIL) tablet 800 mg (has no administration in time range)  acetaminophen (TYLENOL) tablet 650 mg (has no administration in time range)    ED Course/ Medical Decision Making/ A&P                                 Medical Decision Making Amount and/or Complexity of  Data Reviewed Labs: ordered. Decision-making details documented in ED Course. Radiology: ordered and independent interpretation performed. Decision-making details documented in ED Course. ECG/medicine tests: ordered and independent interpretation performed. Decision-making details documented in ED Course.  Risk OTC drugs. Prescription drug management.   2 days of influenza-like illness with cough, congestion, fever, chest pain with coughing headache and sore throat.  Ill-appearing but nontoxic.  Febrile and tachycardic on arrival.  No hypoxia or increased work of breathing.  Will check chest x-ray, EKG, give antipyretics.  Flu swab is positive.  Tachycardia, fever and respiratory rate have improved with antipyretics.  No hypoxia or increased work of breathing.  EKG shows nonspecific T wave changes similar to previous.  No exertional chest pain.  Xray Negative for pneumonia or pneumothorax.  Results reviewed and interpreted by me  Patient feels improved.  She is tolerating p.o.  No hypoxia.  No increased work of breathing.  Discussed risk and benefits of Tamiflu and she agrees to proceed.  Will give p.o. hydration, antipyretics and PCP follow-up.  Return the ED with exertional chest pain, pain associate with shortness of breath, nausea, vomiting, sweating, other concerns.        Final Clinical Impression(s) / ED Diagnoses Final diagnoses:  Influenza    Rx / DC Orders ED  Discharge Orders     None         Janda Cargo, Jeannett Senior, MD 10/26/23 [phone]

## 2023-10-26 NOTE — ED Triage Notes (Signed)
Cough, ear and chest congestion, back pain and headache due to cough. Started yesterday.

## 2024-04-03 ENCOUNTER — Encounter (HOSPITAL_BASED_OUTPATIENT_CLINIC_OR_DEPARTMENT_OTHER): Payer: Self-pay | Admitting: Emergency Medicine

## 2024-04-03 ENCOUNTER — Other Ambulatory Visit: Payer: Self-pay

## 2024-04-03 ENCOUNTER — Emergency Department (HOSPITAL_BASED_OUTPATIENT_CLINIC_OR_DEPARTMENT_OTHER)

## 2024-04-03 ENCOUNTER — Emergency Department (HOSPITAL_BASED_OUTPATIENT_CLINIC_OR_DEPARTMENT_OTHER)
Admission: EM | Admit: 2024-04-03 | Discharge: 2024-04-03 | Disposition: A | Discharge status: Home / Self Care | Attending: Emergency Medicine | Admitting: Emergency Medicine

## 2024-04-03 DIAGNOSIS — D72819 Decreased white blood cell count, unspecified: Secondary | ICD-10-CM | POA: Diagnosis not present

## 2024-04-03 DIAGNOSIS — J209 Acute bronchitis, unspecified: Secondary | ICD-10-CM | POA: Diagnosis not present

## 2024-04-03 DIAGNOSIS — Z79899 Other long term (current) drug therapy: Secondary | ICD-10-CM | POA: Diagnosis not present

## 2024-04-03 DIAGNOSIS — R059 Cough, unspecified: Secondary | ICD-10-CM | POA: Diagnosis present

## 2024-04-03 DIAGNOSIS — I10 Essential (primary) hypertension: Secondary | ICD-10-CM | POA: Insufficient documentation

## 2024-04-03 LAB — BASIC METABOLIC PANEL WITH GFR
Anion gap: 12 (ref 5–15)
BUN: 10 mg/dL (ref 8–23)
CO2: 27 mmol/L (ref 22–32)
Calcium: 9.3 mg/dL (ref 8.9–10.3)
Chloride: 103 mmol/L (ref 98–111)
Creatinine, Ser: 0.81 mg/dL (ref 0.44–1.00)
GFR, Estimated: >60 mL/min (ref >60)
Glucose, Bld: 98 mg/dL (ref 70–99)
Potassium: 3.8 mmol/L (ref 3.5–5.1)
Sodium: 142 mmol/L (ref 135–145)

## 2024-04-03 LAB — CBC
HCT: 44.4 % (ref 36.0–46.0)
Hemoglobin: 14.9 g/dL (ref 12.0–15.0)
MCH: 30.5 pg (ref 26.0–34.0)
MCHC: 33.6 g/dL (ref 30.0–36.0)
MCV: 91 fL (ref 80.0–100.0)
Platelets: 242 K/uL (ref 150–400)
RBC: 4.88 MIL/uL (ref 3.87–5.11)
RDW: 13.7 % (ref 11.5–15.5)
WBC: 3.4 K/uL — ABNORMAL LOW (ref 4.0–10.5)
nRBC: 0 % (ref 0.0–0.2)

## 2024-04-03 MED ORDER — ADVAIR DISKUS 100-50 MCG/ACT IN AEPB: 1.0000 | INHALATION_SPRAY | Freq: Two times a day (BID) | RESPIRATORY_TRACT | 0 refills | Status: AC

## 2024-04-03 MED ORDER — MAGNESIUM SULFATE 2 GM/50ML IV SOLN
2.0000 g | INTRAVENOUS | Status: AC
  Administered 2024-04-03: 2 g via INTRAVENOUS
  Filled 2024-04-03: qty 50

## 2024-04-03 MED ORDER — IPRATROPIUM-ALBUTEROL 0.5-2.5 (3) MG/3ML IN SOLN
3.0000 mL | Freq: Once | RESPIRATORY_TRACT | Status: AC
  Administered 2024-04-03: 3 mL via RESPIRATORY_TRACT

## 2024-04-03 MED ORDER — METHYLPREDNISOLONE 4 MG PO TBPK: ORAL_TABLET | ORAL | 0 refills | Status: AC

## 2024-04-03 MED ORDER — METHYLPREDNISOLONE SODIUM SUCC 125 MG IJ SOLR
125.0000 mg | Freq: Once | INTRAMUSCULAR | Status: AC
  Administered 2024-04-03: 125 mg via INTRAVENOUS
  Filled 2024-04-03: qty 2

## 2024-04-03 MED ORDER — SODIUM CHLORIDE 0.9 % IV BOLUS
1000.0000 mL | Freq: Once | INTRAVENOUS | Status: AC
  Administered 2024-04-03: 1000 mL via INTRAVENOUS

## 2024-04-03 MED ORDER — AEROCHAMBER PLUS FLO-VU MISC
1.0000 | Freq: Once | Status: AC
  Administered 2024-04-03: 1
  Filled 2024-04-03: qty 1

## 2024-04-03 MED ORDER — ALBUTEROL SULFATE HFA 108 (90 BASE) MCG/ACT IN AERS
2.0000 | INHALATION_SPRAY | Freq: Once | RESPIRATORY_TRACT | Status: AC
Start: 2024-04-03 — End: 2024-04-03
  Administered 2024-04-03: 2 via RESPIRATORY_TRACT
  Filled 2024-04-03: qty 6.7

## 2024-04-03 NOTE — Discharge Instructions (Signed)
 Take your albuterol  every 4 hours (1-2 puffs) Make sure that you wash out your mouth and spit after each time that you use the Advair  diskus Contact a health care provider if: Your symptoms do not improve after 2 weeks. You have trouble coughing up the mucus. Your cough keeps you awake at night. You have a fever. Get help right away if you: Cough up blood. Feel pain in your chest. Have severe shortness of breath. Faint or keep feeling like you are going to faint. Have a severe headache. Have a fever or chills that get worse. These symptoms may represent a serious problem that is an emergency. Do not wait to see if the symptoms will go away. Get medical help right away. Call your local emergency services (911 in the [place] Do not drive yourself to the hospital.

## 2024-04-03 NOTE — ED Provider Notes (Signed)
 [place] EM[facility] DEPARTMENT AT [facility] Provider Note   CSN: [id number] Arrival date & time: 04/03/24  1349     Patient presents with: Chest Pain and Cough   Kari Webb is a 63 y.o. female onset 2 weeks ago. Had coughing, Eariache. Sae pcp whp started her on ABX for ear infection . He switched abx but it did not haelp. [name] also take n a round aof steroids. The patient reports progressive worsening of cough,. Worse at night. . Last night she had hot and cold chills.  No hx of asthma. No smoking.     Chest Pain Associated symptoms: cough   Cough Associated symptoms: chest pain        Prior to Admission medications   Medication Sig Start Date End Date Taking? Authorizing Provider  celecoxib  (CELEBREX ) 200 MG capsule Take 1 capsule (200 mg total) by mouth 2 (two) times daily. 07/13/22   Roemhildt, Lorin T, PA-C  cholecalciferol (VITAMIN D3) 25 MCG (1000 UNIT) tablet Take 1,000 Units by mouth daily.    [provider]  HYDROcodone -acetaminophen  (NORCO) 5-325 MG tablet 1-2 tabs po q6 hours prn pain 11/01/19   Kuzma, Kevin, MD  lidocaine  (LIDODERM ) 5 % Place 1 patch onto the skin daily. Remove & Discard patch within 12 hours or as directed by MD 06/03/21   Loeffler, Ronnald BROCKS, PA-C  methocarbamol  (ROBAXIN ) 500 MG tablet Take 1 tablet (500 mg total) by mouth 2 (two) times daily. 06/03/21   Loeffler, Ronnald BROCKS, PA-C  olmesartan (BENICAR) 20 MG tablet Take 20 mg by mouth daily.    [provider]  oseltamivir  (TAMIFLU ) 75 MG capsule Take 1 capsule (75 mg total) by mouth every 12 (twelve) hours. 10/26/23   Rancour, Garnette, MD  vitamin C (ASCORBIC ACID) 250 MG tablet Take 250 mg by mouth daily.    [provider]  vitamin E (VITAMIN E) 180 MG (400 UNITS) capsule Take 400 Units by mouth daily.    [provider]    Allergies: Amlodipine, Hydrochlorothiazide, Hydrocodone -acetaminophen , Valsartan, and Asa [aspirin]    Review of Systems  Respiratory:   Positive for cough.   Cardiovascular:  Positive for chest pain.    Updated Vital Signs BP (!) 159/91   Pulse 67   Temp 98.4 F (36.9 C)   Resp 16   Wt 83 kg   SpO2 96%   BMI 33.47 kg/m   Physical Exam Vitals and nursing note reviewed.  Constitutional:      General: She is not in acute distress.    Appearance: She is well-developed. She is not diaphoretic.  HENT:     Head: Normocephalic and atraumatic.     Right Ear: External ear normal.     Left Ear: External ear normal.     Nose: Nose normal.     Mouth/Throat:     Mouth: Mucous membranes are moist.  Eyes:     General: No scleral icterus.    Conjunctiva/sclera: Conjunctivae normal.  Cardiovascular:     Rate and Rhythm: Normal rate and regular rhythm.     Heart sounds: Normal heart sounds. No murmur heard.    No friction rub. No gallop.  Pulmonary:     Effort: Pulmonary effort is normal. Prolonged expiration present. No respiratory distress.     Breath sounds: Wheezing and rhonchi present. No rales.  Abdominal:     General: Bowel sounds are normal. There is no distension.     Palpations: Abdomen is soft. There  is no mass.     Tenderness: There is no abdominal tenderness. There is no guarding.  Musculoskeletal:     Cervical back: Normal range of motion.     Right lower leg: No edema.     Left lower leg: No edema.  Skin:    General: Skin is warm and dry.  Neurological:     Mental Status: She is alert and oriented to person, place, and time.  Psychiatric:        Behavior: Behavior normal.     (all labs ordered are listed, but only abnormal results are displayed) Labs Reviewed  CBC - Abnormal; Notable for the following components:      Result Value   WBC 3.4 (*)    All other components within normal limits  BASIC METABOLIC PANEL WITH GFR    EKG: EKG Interpretation Date/Time:  Wednesday April 03 2024 14:05:28 EDT Ventricular Rate:  75 PR Interval:  135 QRS Duration:  94 QT Interval:  393 QTC  Calculation: 439 R Axis:   15  Text Interpretation: Sinus rhythm LVH by voltage No significant change since last tracing Confirmed by Dean Clarity [phone]) on 04/03/2024 2:07:01 PM  Radiology: No results found.   Procedures   Medications Ordered in the ED  ipratropium-albuterol  (DUONEB) 0.5-2.5 (3) MG/3ML nebulizer solution 3 mL (has no administration in time range)  magnesium  sulfate IVPB 2 g 50 mL (has no administration in time range)  methylPREDNISolone  sodium succinate (SOLU-MEDROL ) 125 mg/2 mL injection 125 mg (has no administration in time range)  sodium chloride  0.9 % bolus 1,000 mL (has no administration in time range)    Clinical Course as of 04/03/24 1441  Wed Apr 03, 2024  1433 Troponin cancelled , sxs not consistent with cardiac complaint  [AH]    Clinical Course User Index [AH] Sharlynn Seckinger, PA-C                                 Medical Decision Making This patient presents to the ED for concern of cough and sob, this involves an extensive number of treatment options, and is a complaint that carries with it a high risk of complications and morbidity.  Differential diagnosis for emergent cause of cough includes but is not limited to upper respiratory infection, lower respiratory infection, allergies, asthma, irritants, foreign body, medications such as ACE inhibitors, reflux, asthma, CHF, lung cancer, interstitial lung disease, psychiatric causes, postnasal drip and postinfectious bronchospasm.   Co morbidities:        has a past medical history of Finger mass, right and Hypertension.   Social Determinants of Health:        SDOH Screenings Tobacco Use: Low Risk  (04/03/2024)   Additional history:  {Additional history obtained from previous ED visits and old xrays   Lab Tests:  I Ordered, and personally interpreted labs.  The pertinent results include:   Mild leukopenia- insignificant Normal glucose   Imaging Studies:  I ordered imaging studies  including 2 V cxr I independently visualized and interpreted imaging which showed no acute findings I agree with the radiologist interpretation  Cardiac Monitoring/ECG:       The patient was maintained on a cardiac monitor.  I personally viewed and interpreted the cardiac monitored which showed an underlying rhythm of NSR rate of75  Medicines ordered and prescription drug management:  I ordered medication including Medications ipratropium-albuterol  (DUONEB) 0.5-2.5 (3) MG/3ML nebulizer solution 3  mL (has no administration in time range) magnesium  sulfate IVPB 2 g 50 mL (has no administration in time range) methylPREDNISolone  sodium succinate (SOLU-MEDROL ) 125 mg/2 mL injection 125 mg (has no administration in time range) sodium chloride  0.9 % bolus 1,000 mL (has no administration in time range) for bronchospasm Reevaluation of the patient after these medicines showed that the patient resolved I have reviewed the patients home medicines and have made adjustments as needed  Test Considered:       I considered CT chest but the patient's exam is consistent with bronchospasm  Critical Interventions:         Consultations Obtained:   Problem List / ED Course:       Acute bronchitis with bronchospasm  (primary encounter diagnosis)    MDM: patient with cough and wheezing. No fever, no hypoxia. Exam consistent with bronchospasm. Her sxs resolved with tx as per chart.  Will dc with continued tx. All wheezing resolved    Dispostion:  After consideration of the diagnostic results and the patients response to treatment, I feel that the patent would benefit from dx with close follow up.    Amount and/or Complexity of Data Reviewed Labs: ordered. Radiology: ordered.  Risk Prescription drug management.        Final diagnoses:  None    ED Discharge Orders     None          Arloa Chroman, PA-C 04/04/24 [id number]    Dean Clarity, MD 04/05/24 [phone]

## 2024-04-03 NOTE — ED Triage Notes (Signed)
 Cough 2 weeks ago , treated with antibiotics with no relief , chest pain x 1 day . Shortness of breath today ,
